# Patient Record
Sex: Female | Born: 1990 | Race: White | Hispanic: No | Marital: Married | Smoking: Never smoker
Health system: Southern US, Community
[De-identification: ages and names within clinical notes are randomized; demographics above are authoritative.]

## PROBLEM LIST (undated history)

## (undated) ENCOUNTER — Inpatient Hospital Stay (HOSPITAL_COMMUNITY): Payer: Self-pay

## (undated) DIAGNOSIS — K831 Obstruction of bile duct: Secondary | ICD-10-CM

## (undated) DIAGNOSIS — Z8719 Personal history of other diseases of the digestive system: Secondary | ICD-10-CM

## (undated) DIAGNOSIS — D693 Immune thrombocytopenic purpura: Secondary | ICD-10-CM

## (undated) DIAGNOSIS — O26619 Liver and biliary tract disorders in pregnancy, unspecified trimester: Secondary | ICD-10-CM

## (undated) DIAGNOSIS — O24419 Gestational diabetes mellitus in pregnancy, unspecified control: Secondary | ICD-10-CM

## (undated) DIAGNOSIS — Z789 Other specified health status: Secondary | ICD-10-CM

## (undated) DIAGNOSIS — O09293 Supervision of pregnancy with other poor reproductive or obstetric history, third trimester: Secondary | ICD-10-CM

## (undated) HISTORY — DX: Immune thrombocytopenic purpura: D69.3

## (undated) HISTORY — PX: DILATION AND CURETTAGE OF UTERUS: SHX78

## (undated) HISTORY — PX: NOSE SURGERY: SHX723

## (undated) HISTORY — DX: Liver and biliary tract disorders in pregnancy, unspecified trimester: O26.619

## (undated) HISTORY — DX: Obstruction of bile duct: K83.1

## (undated) HISTORY — DX: Gestational diabetes mellitus in pregnancy, unspecified control: O24.419

## (undated) HISTORY — PX: WISDOM TOOTH EXTRACTION: SHX21

---
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
## 2011-01-18 NOTE — L&D Delivery Note (Signed)
Delivery Note At 8:05 AM a viable and healthy female was delivered via Vaginal, Spontaneous Delivery (Presentation: Left; Occiput Anterior).  APGAR: 9, 9; weight Pending.   Placenta status: Intact, Spontaneous.  Cord: 3 vessels  Anesthesia: Epidural  Episiotomy: None Lacerations: Periurethral Suture Repair: vicryl 4-0 Est. Blood Loss (mL):   Mom to postpartum.  Baby to nursery-stable.  Paige Levitt H. 10/09/2011, 8:30 AM

## 2011-02-18 ENCOUNTER — Inpatient Hospital Stay (HOSPITAL_COMMUNITY)
Admission: AD | Admit: 2011-02-18 | Discharge: 2011-02-19 | Disposition: A | Discharge status: Home / Self Care | Payer: BC Managed Care – PPO | Source: Ambulatory Visit | Attending: Obstetrics and Gynecology | Admitting: Obstetrics and Gynecology

## 2011-02-18 DIAGNOSIS — B3731 Acute candidiasis of vulva and vagina: Secondary | ICD-10-CM | POA: Insufficient documentation

## 2011-02-18 DIAGNOSIS — O209 Hemorrhage in early pregnancy, unspecified: Secondary | ICD-10-CM | POA: Insufficient documentation

## 2011-02-18 DIAGNOSIS — B373 Candidiasis of vulva and vagina: Secondary | ICD-10-CM | POA: Insufficient documentation

## 2011-02-18 DIAGNOSIS — O2390 Unspecified genitourinary tract infection in pregnancy, unspecified trimester: Secondary | ICD-10-CM | POA: Insufficient documentation

## 2011-02-19 ENCOUNTER — Encounter (HOSPITAL_COMMUNITY): Payer: Self-pay

## 2011-02-19 LAB — URINALYSIS, ROUTINE W REFLEX MICROSCOPIC
Bilirubin Urine: NEGATIVE
Specific Gravity, Urine: 1.025 (ref 1.005–1.030)
pH: 6 (ref 5.0–8.0)

## 2011-02-19 LAB — WET PREP, GENITAL: Yeast Wet Prep HPF POC: NONE SEEN

## 2011-02-19 LAB — URINE MICROSCOPIC-ADD ON

## 2011-02-19 MED ORDER — TERCONAZOLE 0.4 % VA CREA: 1.0000 | TOPICAL_CREAM | Freq: Every day | VAGINAL | Status: AC

## 2011-02-19 NOTE — Progress Notes (Signed)
Patient is here with c/o pinkish /dark vaginal bleeding that started yesterday. She states that she only sees the blood when she is up and walking around. She denies any pain or recent intercourse.

## 2011-02-19 NOTE — Progress Notes (Signed)
Pt states, " I started having brown to pink vaginal bleeding yesterday at work. I went home and layed down and it stopped. I went back to work today and the bleeding started back while I was up and on my feet. I had a 6 wk miscarriage in September. I am having some cramping but it is the same as always."

## 2011-02-19 NOTE — Progress Notes (Signed)
Written and verbal d/c instructions given and understanding voiced. 

## 2011-02-19 NOTE — ED Provider Notes (Signed)
History     Chief Complaint  Patient presents with  . Vaginal Bleeding  . Abdominal Pain   HPI Paige Welch 21 y.o. [redacted]w[redacted]d gestation.  Has been for an appointment for labs and ultrasound but has not yet had physical exam in [facility].  Tonight saw some blood on tissue.  Has had for a couple of days.  Sees more when she is up walking.  Has had an ultrasound which showed a heart beat.  Is scheduled for next prenatal care appt on 2-14-3.  OB History    Grav Para Term Preterm Abortions TAB SAB Ect Mult Living   2    1  1          Past Medical History  Diagnosis Date  . No pertinent past medical history     Past Surgical History  Procedure Date  . Nose surgery   . Wisdom tooth extraction   . Dilation and curettage of uterus     Family History  Problem Relation Age of Onset  . Diabetes Maternal Aunt   . Heart disease Maternal Uncle   . Hypertension Maternal Grandmother   . Stroke Maternal Grandfather     History  Substance Use Topics  . Smoking status: Never Smoker   . Smokeless tobacco: Not on file  . Alcohol Use: No    Allergies:  Allergies  Allergen Reactions  . Penicillins Hives    Prescriptions prior to admission  Medication Sig Dispense Refill  . acetaminophen (TYLENOL) 325 MG tablet Take 650 mg by mouth every 6 (six) hours as needed. Head aches      . Prenatal Vit-Fe Fumarate-FA (PRENATAL MULTIVITAMIN) TABS Take 1 tablet by mouth at bedtime.        Review of Systems  Gastrointestinal: Negative for nausea, vomiting and abdominal pain.  Genitourinary:       Vaginal discharge Blood when wiping   Physical Exam   Blood pressure 109/68, pulse 79, temperature 98.6 F (37 C), temperature source Oral, resp. rate 20, height 5\' 3"  (1.6 m), weight 120 lb 2 oz (54.488 kg), last menstrual period 12/27/2010, SpO2 99.00%.  Physical Exam  Nursing note and vitals reviewed. Constitutional: She is oriented to person, place, and time. She appears well-developed and  well-nourished.  HENT:  Head: Normocephalic.  Eyes: EOM are normal.  Neck: Neck supple.  GI: Soft. There is no tenderness.  Genitourinary:       Speculum exam: Vulva - labia majora very pink to groin, dried discharge seen in tissue folds Vagina - mild erythema, Small amount of tan discharge, no odor Cervix - No contact bleeding, no active bleeding seen Bimanual exam: Cervix closed Uterus non tender, 8 week size Adnexa non tender, no masses bilaterally GC/Chlam, wet prep done Chaperone present for exam.  Musculoskeletal: Normal range of motion.  Neurological: She is alert and oriented to person, place, and time.  Skin: Skin is warm and dry.  Psychiatric: She has a normal mood and affect.    MAU Course  Procedures  MDM Results for orders placed during the hospital encounter of 02/18/11 (from the past 24 hour(s))  URINALYSIS, ROUTINE W REFLEX MICROSCOPIC     Status: Abnormal   Collection Time   02/19/11 12:40 AM      Component Value Range   Color, Urine YELLOW  YELLOW    APPearance CLEAR  CLEAR    Specific Gravity, Urine 1.025  1.005 - 1.030    pH 6.0  5.0 - 8.0  Glucose, UA NEGATIVE  NEGATIVE (mg/dL)   Hgb urine dipstick SMALL (*) NEGATIVE    Bilirubin Urine NEGATIVE  NEGATIVE    Ketones, ur NEGATIVE  NEGATIVE (mg/dL)   Protein, ur NEGATIVE  NEGATIVE (mg/dL)   Urobilinogen, UA 0.2  0.0 - 1.0 (mg/dL)   Nitrite NEGATIVE  NEGATIVE    Leukocytes, UA NEGATIVE  NEGATIVE   URINE MICROSCOPIC-ADD ON     Status: Abnormal   Collection Time   02/19/11 12:40 AM      Component Value Range   Squamous Epithelial / LPF FEW (*) RARE    WBC, UA 0-2  <3 (WBC/hpf)   RBC / HPF 0-2  <3 (RBC/hpf)   Bacteria, UA FEW (*) RARE    Urine-Other MUCOUS PRESENT    WET PREP, GENITAL     Status: Abnormal   Collection Time   02/19/11  1:39 AM      Component Value Range   Yeast Wet Prep HPF POC NONE SEEN  NONE SEEN    Trich, Wet Prep NONE SEEN  NONE SEEN    Clue Cells Wet Prep HPF POC FEW (*) NONE  SEEN    WBC, Wet Prep HPF POC FEW (*) NONE SEEN     Assessment and Plan  Yeast infection in pregnancy  Plan Keep your appointments for prenatal care Call your doctor if you have questions or concerns Go to [facility] if you need medical care - the facility where your doctor has privileges. rx terazol vaginal cream - use in vagina at night x 7 days  Drink at least 8 8-oz glasses of water every day.  Paige Welch 02/19/2011, 1:42 AM   Nolene Bernheim, NP 02/19/11 0202  Nolene Bernheim, NP 02/19/11 [id number]

## 2011-02-21 LAB — POCT PREGNANCY, URINE: Preg Test, Ur: POSITIVE — AB

## 2011-02-21 LAB — GC/CHLAMYDIA PROBE AMP, GENITAL
Chlamydia, DNA Probe: NEGATIVE
GC Probe Amp, Genital: NEGATIVE

## 2011-02-22 NOTE — ED Provider Notes (Signed)
Attestation of Attending Supervision of Advanced Practitioner: Evaluation and management procedures were performed by the PA/NP/CNM/OB Fellow under my supervision/collaboration. Chart reviewed and agree with management and plan.  Mckenzy Salazar V 02/22/2011 6:52 PM

## 2011-03-02 LAB — OB RESULTS CONSOLE ABO/RH

## 2011-03-02 LAB — OB RESULTS CONSOLE HIV ANTIBODY (ROUTINE TESTING): HIV: NONREACTIVE

## 2011-06-11 ENCOUNTER — Encounter (HOSPITAL_COMMUNITY): Payer: Self-pay

## 2011-06-11 ENCOUNTER — Inpatient Hospital Stay (HOSPITAL_COMMUNITY)
Admission: AD | Admit: 2011-06-11 | Discharge: 2011-06-12 | Disposition: A | Discharge status: Home / Self Care | Payer: BC Managed Care – PPO | Source: Ambulatory Visit | Attending: Obstetrics & Gynecology | Admitting: Obstetrics & Gynecology

## 2011-06-11 DIAGNOSIS — N949 Unspecified condition associated with female genital organs and menstrual cycle: Secondary | ICD-10-CM

## 2011-06-11 DIAGNOSIS — R109 Unspecified abdominal pain: Secondary | ICD-10-CM | POA: Insufficient documentation

## 2011-06-11 DIAGNOSIS — O479 False labor, unspecified: Secondary | ICD-10-CM

## 2011-06-11 DIAGNOSIS — O4700 False labor before 37 completed weeks of gestation, unspecified trimester: Secondary | ICD-10-CM | POA: Insufficient documentation

## 2011-06-11 LAB — URINALYSIS, ROUTINE W REFLEX MICROSCOPIC
Glucose, UA: NEGATIVE mg/dL
Leukocytes, UA: NEGATIVE
Nitrite: NEGATIVE
Protein, ur: NEGATIVE mg/dL

## 2011-06-11 NOTE — MAU Note (Signed)
Lower abdominal cramping started this morning. Denies vaginal bleeding. Small amount of white discharge. Last intercourse x1 week ago. Reports positive fetal movement.

## 2011-06-11 NOTE — MAU Provider Note (Signed)
Chief Complaint:  No chief complaint on file.   None     HPI  Paige Welch is a 21 y.o. G2P0010 at [redacted]w[redacted]d presenting with cramping ~2x/hr since this morning.  Denies contractions, leakage of fluid or vaginal bleeding. Good fetal movement.   Pregnancy Course: uncomplicated  Past Medical History: Past Medical History  Diagnosis Date  . No pertinent past medical history     Past Surgical History: Past Surgical History  Procedure Date  . Nose surgery   . Wisdom tooth extraction   . Dilation and curettage of uterus     Family History: Family History  Problem Relation Age of Onset  . Diabetes Maternal Aunt   . Heart disease Maternal Uncle   . Hypertension Maternal Grandmother   . Stroke Maternal Grandfather   . Anesthesia problems Neg Hx     Social History: History  Substance Use Topics  . Smoking status: Never Smoker   . Smokeless tobacco: Not on file  . Alcohol Use: No    Allergies:  Allergies  Allergen Reactions  . Penicillins Hives    Meds:  Prescriptions prior to admission  Medication Sig Dispense Refill  . acetaminophen (TYLENOL) 325 MG tablet Take 650 mg by mouth every 6 (six) hours as needed. Head aches      . Prenatal Vit-Fe Fumarate-FA (PRENATAL MULTIVITAMIN) TABS Take 1 tablet by mouth at bedtime.       ROS: Otherwise neg   Physical Exam  Blood pressure 109/65, pulse 83, temperature 97.7 F (36.5 C), temperature source Oral, resp. rate 18, height 5\' 3"  (1.6 m), weight 60.963 kg (134 lb 6.4 oz), last menstrual period 12/27/2010. GENERAL: Well-developed, well-nourished female in no acute distress.  HEENT: normocephalic, good dentition HEART: normal rate RESP: normal effort ABDOMEN: Soft, nontender, nondistended, gravid.  EXTREMITIES: Nontender, no edema NEURO: alert and oriented  SPECULUM EXAM: Physiologic discharge, no blood. Cervix clean.  FHT:  Baseline 140 , moderate variability, no accelerations, no decelerations Contractions:  none  Dilation: Closed Effacement (%): Thick Exam by:: V Taheera Thomann CNM   Labs: Results for orders placed during the hospital encounter of 06/11/11 (from the past 24 hour(s))  URINALYSIS, ROUTINE W REFLEX MICROSCOPIC     Status: Abnormal   Collection Time   06/11/11  9:58 PM      Component Value Range   Color, Urine YELLOW  YELLOW    APPearance CLOUDY (*) CLEAR    Specific Gravity, Urine 1.015  1.005 - 1.030    pH 8.0  5.0 - 8.0    Glucose, UA NEGATIVE  NEGATIVE (mg/dL)   Hgb urine dipstick NEGATIVE  NEGATIVE    Bilirubin Urine NEGATIVE  NEGATIVE    Ketones, ur NEGATIVE  NEGATIVE (mg/dL)   Protein, ur NEGATIVE  NEGATIVE (mg/dL)   Urobilinogen, UA 0.2  0.0 - 1.0 (mg/dL)   Nitrite NEGATIVE  NEGATIVE    Leukocytes, UA NEGATIVE  NEGATIVE    Imaging:  NA  Assessment: 1. Preterm contractions   2. Round ligament pain    Plan: D/C home Follow-up Information    Follow up with Mickel Baas, MD. Schedule an appointment as soon as possible for a visit on 06/14/2011. (As needed if symptoms worsen)    Contact information:   [address] [place] [address] [phone]         Medication List  As of 06/12/2011  6:28 AM   CONTINUE taking these medications  prenatal multivitamin Tabs           PTL precautions and Mertie Clause, Marquelle Balow 5/25/201311:02 PM

## 2011-10-04 ENCOUNTER — Encounter (HOSPITAL_COMMUNITY): Payer: Self-pay | Admitting: *Deleted

## 2011-10-04 ENCOUNTER — Inpatient Hospital Stay (HOSPITAL_COMMUNITY)
Admission: AD | Admit: 2011-10-04 | Discharge: 2011-10-04 | Disposition: A | Discharge status: Home / Self Care | Payer: BC Managed Care – PPO | Source: Ambulatory Visit | Attending: Obstetrics and Gynecology | Admitting: Obstetrics and Gynecology

## 2011-10-04 DIAGNOSIS — O479 False labor, unspecified: Secondary | ICD-10-CM | POA: Insufficient documentation

## 2011-10-04 NOTE — MAU Note (Signed)
Dr. Claiborne Billings notified of pt.  Pt may walk x 1 hr or D/C home.

## 2011-10-04 NOTE — MAU Note (Signed)
Pt to walk.

## 2011-10-04 NOTE — MAU Note (Signed)
Pt returned from walking.  Monitors applied. 

## 2011-10-04 NOTE — MAU Note (Signed)
Contractions, "really bad back pain", denies bleeding or ROM

## 2011-10-08 ENCOUNTER — Inpatient Hospital Stay (HOSPITAL_COMMUNITY)
Admission: AD | Admit: 2011-10-08 | Discharge: 2011-10-08 | Disposition: A | Discharge status: Home / Self Care | Payer: BC Managed Care – PPO | Source: Ambulatory Visit | Attending: Obstetrics and Gynecology | Admitting: Obstetrics and Gynecology

## 2011-10-08 DIAGNOSIS — O479 False labor, unspecified: Secondary | ICD-10-CM | POA: Insufficient documentation

## 2011-10-08 NOTE — MAU Note (Signed)
Pt presents with contractions q 3-4 minutes for the past 2 hours. Pt denies pih symptoms, srom, bleeding.  + fm per pt. Pt is to be induced tomorrow night 10/09/11.  Pt with c/o mucous "snot like" discharge.

## 2011-10-08 NOTE — MAU Note (Signed)
Patient taken directly to room 10 from lobby with c/o contractions for several hours and increased mucus discharge.

## 2011-10-09 ENCOUNTER — Encounter (HOSPITAL_COMMUNITY): Payer: Self-pay | Admitting: *Deleted

## 2011-10-09 ENCOUNTER — Encounter (HOSPITAL_COMMUNITY): Payer: Self-pay | Admitting: Anesthesiology

## 2011-10-09 ENCOUNTER — Inpatient Hospital Stay (HOSPITAL_COMMUNITY): Payer: BC Managed Care – PPO | Admitting: Anesthesiology

## 2011-10-09 ENCOUNTER — Inpatient Hospital Stay (HOSPITAL_COMMUNITY)
Admission: AD | Admit: 2011-10-09 | Discharge: 2011-10-10 | DRG: 373 | Disposition: A | Discharge status: Home / Self Care | Payer: BC Managed Care – PPO | Source: Ambulatory Visit | Attending: Obstetrics and Gynecology | Admitting: Obstetrics and Gynecology

## 2011-10-09 LAB — CBC
MCV: 87.6 fL (ref 78.0–100.0)
Platelets: 280 10*3/uL (ref 150–400)
RDW: 14.2 % (ref 11.5–15.5)
WBC: 16.7 10*3/uL — ABNORMAL HIGH (ref 4.0–10.5)

## 2011-10-09 LAB — RPR: RPR Ser Ql: NONREACTIVE

## 2011-10-09 MED ORDER — PRENATAL MULTIVITAMIN CH
1.0000 | ORAL_TABLET | Freq: Every day | ORAL | Status: DC
  Administered 2011-10-10: 1 via ORAL
  Filled 2011-10-09: qty 1

## 2011-10-09 MED ORDER — LIDOCAINE HCL (PF) 1 % IJ SOLN
INTRAMUSCULAR | Status: DC | PRN
  Administered 2011-10-09 (×2): 8 mL

## 2011-10-09 MED ORDER — LACTATED RINGERS IV SOLN
INTRAVENOUS | Status: DC
  Administered 2011-10-09 (×2): via INTRAVENOUS

## 2011-10-09 MED ORDER — SIMETHICONE 80 MG PO CHEW: 80.0000 mg | CHEWABLE_TABLET | ORAL | Status: DC | PRN

## 2011-10-09 MED ORDER — LIDOCAINE HCL (PF) 1 % IJ SOLN
30.0000 mL | INTRAMUSCULAR | Status: DC | PRN
  Administered 2011-10-09: 30 mL via SUBCUTANEOUS
  Filled 2011-10-09: qty 30

## 2011-10-09 MED ORDER — FENTANYL 2.5 MCG/ML BUPIVACAINE 1/10 % EPIDURAL INFUSION (WH - ANES): 14.0000 mL/h | INTRAMUSCULAR | Status: DC

## 2011-10-09 MED ORDER — SENNOSIDES-DOCUSATE SODIUM 8.6-50 MG PO TABS
2.0000 | ORAL_TABLET | Freq: Every day | ORAL | Status: DC
  Administered 2011-10-09: 2 via ORAL

## 2011-10-09 MED ORDER — LACTATED RINGERS IV SOLN: 500.0000 mL | INTRAVENOUS | Status: DC | PRN

## 2011-10-09 MED ORDER — PHENYLEPHRINE 40 MCG/ML (10ML) SYRINGE FOR IV PUSH (FOR BLOOD PRESSURE SUPPORT): 80.0000 ug | PREFILLED_SYRINGE | INTRAVENOUS | Status: DC | PRN

## 2011-10-09 MED ORDER — OXYCODONE-ACETAMINOPHEN 5-325 MG PO TABS: 1.0000 | ORAL_TABLET | ORAL | Status: DC | PRN

## 2011-10-09 MED ORDER — DIBUCAINE 1 % RE OINT: 1.0000 "application " | TOPICAL_OINTMENT | RECTAL | Status: DC | PRN

## 2011-10-09 MED ORDER — LACTATED RINGERS IV SOLN
500.0000 mL | Freq: Once | INTRAVENOUS | Status: AC
  Administered 2011-10-09: 500 mL via INTRAVENOUS

## 2011-10-09 MED ORDER — OXYTOCIN 40 UNITS IN LACTATED RINGERS INFUSION - SIMPLE MED
62.5000 mL/h | Freq: Once | INTRAVENOUS | Status: DC
  Filled 2011-10-09: qty 1000

## 2011-10-09 MED ORDER — IBUPROFEN 600 MG PO TABS: 600.0000 mg | ORAL_TABLET | Freq: Four times a day (QID) | ORAL | Status: DC | PRN

## 2011-10-09 MED ORDER — IBUPROFEN 600 MG PO TABS
600.0000 mg | ORAL_TABLET | Freq: Four times a day (QID) | ORAL | Status: DC
  Administered 2011-10-09 – 2011-10-10 (×6): 600 mg via ORAL
  Filled 2011-10-09 (×6): qty 1

## 2011-10-09 MED ORDER — BENZOCAINE-MENTHOL 20-0.5 % EX AERO
1.0000 "application " | INHALATION_SPRAY | CUTANEOUS | Status: DC | PRN
  Filled 2011-10-09: qty 56

## 2011-10-09 MED ORDER — ACETAMINOPHEN 325 MG PO TABS: 650.0000 mg | ORAL_TABLET | ORAL | Status: DC | PRN

## 2011-10-09 MED ORDER — OXYCODONE-ACETAMINOPHEN 5-325 MG PO TABS
1.0000 | ORAL_TABLET | ORAL | Status: DC | PRN
  Administered 2011-10-09: 1 via ORAL
  Filled 2011-10-09: qty 1

## 2011-10-09 MED ORDER — BUTORPHANOL TARTRATE 1 MG/ML IJ SOLN: 1.0000 mg | INTRAMUSCULAR | Status: DC | PRN

## 2011-10-09 MED ORDER — OXYTOCIN BOLUS FROM INFUSION
500.0000 mL | Freq: Once | INTRAVENOUS | Status: DC
  Filled 2011-10-09: qty 500

## 2011-10-09 MED ORDER — DIPHENHYDRAMINE HCL 50 MG/ML IJ SOLN: 12.5000 mg | INTRAMUSCULAR | Status: DC | PRN

## 2011-10-09 MED ORDER — METHYLERGONOVINE MALEATE 0.2 MG/ML IJ SOLN: 0.2000 mg | INTRAMUSCULAR | Status: DC | PRN

## 2011-10-09 MED ORDER — ZOLPIDEM TARTRATE 5 MG PO TABS: 5.0000 mg | ORAL_TABLET | Freq: Every evening | ORAL | Status: DC | PRN

## 2011-10-09 MED ORDER — ONDANSETRON HCL 4 MG PO TABS: 4.0000 mg | ORAL_TABLET | ORAL | Status: DC | PRN

## 2011-10-09 MED ORDER — EPHEDRINE 5 MG/ML INJ: 10.0000 mg | INTRAVENOUS | Status: DC | PRN

## 2011-10-09 MED ORDER — FENTANYL 2.5 MCG/ML BUPIVACAINE 1/10 % EPIDURAL INFUSION (WH - ANES)
INTRAMUSCULAR | Status: DC | PRN
  Administered 2011-10-09: 14 mL/h via EPIDURAL

## 2011-10-09 MED ORDER — ONDANSETRON HCL 4 MG/2ML IJ SOLN
4.0000 mg | Freq: Four times a day (QID) | INTRAMUSCULAR | Status: DC | PRN
  Administered 2011-10-09: 4 mg via INTRAVENOUS
  Filled 2011-10-09: qty 2

## 2011-10-09 MED ORDER — EPHEDRINE 5 MG/ML INJ
10.0000 mg | INTRAVENOUS | Status: DC | PRN
  Filled 2011-10-09: qty 4

## 2011-10-09 MED ORDER — PHENYLEPHRINE 40 MCG/ML (10ML) SYRINGE FOR IV PUSH (FOR BLOOD PRESSURE SUPPORT)
80.0000 ug | PREFILLED_SYRINGE | INTRAVENOUS | Status: DC | PRN
  Filled 2011-10-09: qty 5

## 2011-10-09 MED ORDER — DIPHENHYDRAMINE HCL 25 MG PO CAPS: 25.0000 mg | ORAL_CAPSULE | Freq: Four times a day (QID) | ORAL | Status: DC | PRN

## 2011-10-09 MED ORDER — ONDANSETRON HCL 4 MG/2ML IJ SOLN: 4.0000 mg | INTRAMUSCULAR | Status: DC | PRN

## 2011-10-09 MED ORDER — METHYLERGONOVINE MALEATE 0.2 MG PO TABS: 0.2000 mg | ORAL_TABLET | ORAL | Status: DC | PRN

## 2011-10-09 MED ORDER — WITCH HAZEL-GLYCERIN EX PADS: 1.0000 "application " | MEDICATED_PAD | CUTANEOUS | Status: DC | PRN

## 2011-10-09 MED ORDER — LACTATED RINGERS IV SOLN: 500.0000 mL | Freq: Once | INTRAVENOUS | Status: DC

## 2011-10-09 MED ORDER — CITRIC ACID-SODIUM CITRATE 334-500 MG/5ML PO SOLN: 30.0000 mL | ORAL | Status: DC | PRN

## 2011-10-09 MED ORDER — TETANUS-DIPHTH-ACELL PERTUSSIS 5-2.5-18.5 LF-MCG/0.5 IM SUSP
0.5000 mL | Freq: Once | INTRAMUSCULAR | Status: AC
  Administered 2011-10-10: 0.5 mL via INTRAMUSCULAR
  Filled 2011-10-09: qty 0.5

## 2011-10-09 MED ORDER — FENTANYL 2.5 MCG/ML BUPIVACAINE 1/10 % EPIDURAL INFUSION (WH - ANES)
14.0000 mL/h | INTRAMUSCULAR | Status: DC
  Filled 2011-10-09: qty 60

## 2011-10-09 MED ORDER — LANOLIN HYDROUS EX OINT: TOPICAL_OINTMENT | CUTANEOUS | Status: DC | PRN

## 2011-10-09 NOTE — Anesthesia Procedure Notes (Signed)
Epidural Patient location during procedure: OB Start time: 10/09/2011 4:54 AM End time: 10/09/2011 4:58 AM  Staffing Anesthesiologist: Sandrea Hughs Performed by: anesthesiologist   Preanesthetic Checklist Completed: patient identified, site marked, surgical consent, pre-op evaluation, timeout performed, IV checked, risks and benefits discussed and monitors and equipment checked  Epidural Patient position: sitting Prep: site prepped and draped and DuraPrep Patient monitoring: continuous pulse ox and blood pressure Approach: midline Injection technique: LOR air  Needle:  Needle type: Tuohy  Needle gauge: 17 G Needle length: 9 cm and 9 Needle insertion depth: 6 cm Catheter type: closed end flexible Catheter size: 19 Gauge Catheter at skin depth: 10 cm Test dose: negative and Other  Assessment Sensory level: T8 Events: blood not aspirated, injection not painful, no injection resistance, negative IV test and no paresthesia  Additional Notes Reason for block:procedure for pain

## 2011-10-09 NOTE — Anesthesia Postprocedure Evaluation (Signed)
  Anesthesia Post-op Note  Patient: Paige Welch  Procedure(s) Performed: * No procedures listed *  Patient Location: PACU and Mother/Baby  Anesthesia Type: Epidural  Level of Consciousness: awake, alert  and oriented  Airway and Oxygen Therapy: Patient Spontanous Breathing    Post-op Assessment: Patient's Cardiovascular Status Stable and Respiratory Function Stable  Post-op Vital Signs: stable  Complications: No apparent anesthesia complications

## 2011-10-09 NOTE — MAU Note (Signed)
Pt returned to MAU with stronger uc's

## 2011-10-09 NOTE — Anesthesia Preprocedure Evaluation (Signed)

## 2011-10-09 NOTE — H&P (Signed)
Paige Welch is a 21 y.o. female presenting for labor  Pt presentred to MAU with painful contractions.  She had been seen earlier in the night and had been sent home for no cervical change.  She returned c/o more painful contractions and had made cervical change and was admitted. Pt transferred care at 19 weeks, pregnancy has been uncomplicated to this point.  History OB History    Grav Para Term Preterm Abortions TAB SAB Ect Mult Living   2 0 0 0 1 0 1 0 0 0      Past Medical History  Diagnosis Date  . No pertinent past medical history    Past Surgical History  Procedure Date  . Nose surgery   . Wisdom tooth extraction   . Dilation and curettage of uterus    Family History: family history includes Diabetes in her maternal aunt; Heart disease in her maternal uncle; Hypertension in her maternal grandmother; and Stroke in her maternal grandfather.  There is no history of Anesthesia problems. Social History:  reports that she has never smoked. She does not have any smokeless tobacco history on file. She reports that she does not drink alcohol or use illicit drugs.   Prenatal Transfer Tool  Maternal Diabetes: No Genetic Screening:  Did not have done Maternal Ultrasounds/Referrals: Normal Fetal Ultrasounds or other Referrals:  None Maternal Substance Abuse:  No Significant Maternal Medications:  None Significant Maternal Lab Results:  None Other Comments:  None  ROS: as above  Dilation: 10 Effacement (%): 100 Station: +2;+3 Exam by:: A.Davis,RN Blood pressure 103/59, pulse 101, temperature 98.2 F (36.8 C), temperature source Oral, resp. rate 18, last menstrual period 12/27/2010. Exam Physical Exam  Prenatal labs: ABO, Rh: O/Positive/-- (02/13 0000) Antibody: Negative (02/13 0000) Rubella: Immune (02/13 0000) RPR: NON REACTIVE (09/22 0403)  HBsAg: Negative (02/13 0000)  HIV: Non-reactive (02/13 0000)  GBS:   negative  Assessment/Plan: 1) Admit 2)  Epidural  Deshante Cassell H. 10/09/2011, 8:33 AM

## 2011-10-10 LAB — CBC
Platelets: 241 10*3/uL (ref 150–400)
RBC: 3.52 MIL/uL — ABNORMAL LOW (ref 3.87–5.11)
RDW: 14.4 % (ref 11.5–15.5)
WBC: 15.5 10*3/uL — ABNORMAL HIGH (ref 4.0–10.5)

## 2011-10-10 MED ORDER — INFLUENZA VIRUS VACC SPLIT PF IM SUSP
0.5000 mL | Freq: Once | INTRAMUSCULAR | Status: AC
  Administered 2011-10-10: 0.5 mL via INTRAMUSCULAR
  Filled 2011-10-10: qty 0.5

## 2011-10-10 MED ORDER — IBUPROFEN 600 MG PO TABS: 600.0000 mg | ORAL_TABLET | Freq: Four times a day (QID) | ORAL | Status: DC

## 2011-10-10 NOTE — Discharge Summary (Signed)
Obstetric Discharge Summary Reason for Admission: onset of labor Prenatal Procedures: none Intrapartum Procedures: spontaneous vaginal delivery Postpartum Procedures: none Complications-Operative and Postpartum: periurethral laceration Hemoglobin  Date Value Range Status  10/10/2011 10.1* 12.0 - 15.0 g/dL Final     HCT  Date Value Range Status  10/10/2011 31.3* 36.0 - 46.0 % Final    Physical Exam:  General: alert and cooperative Lochia: appropriate Uterine Fundus: firm DVT Evaluation: No evidence of DVT seen on physical exam.  Discharge Diagnoses: Term Pregnancy-delivered  Discharge Information: Date: 10/10/2011 Activity: pelvic rest Diet: routine Medications: PNV and Ibuprofen Condition: stable Instructions: refer to practice specific booklet Discharge to: home Follow-up Information    Follow up with Almon Hercules., MD. In 4 weeks.   Contact information:   [address] [place] [address] [phone]          Newborn Data: Live born female  Birth Weight: 6 lb 12.1 oz (3065 g) APGAR: 9, 9  Home with mother.  Paige Welch 10/10/2011, 9:36 AM

## 2013-02-07 ENCOUNTER — Other Ambulatory Visit: Payer: Self-pay | Admitting: Obstetrics and Gynecology

## 2013-02-07 DIAGNOSIS — N6310 Unspecified lump in the right breast, unspecified quadrant: Secondary | ICD-10-CM

## 2013-02-14 ENCOUNTER — Ambulatory Visit
Admission: RE | Admit: 2013-02-14 | Discharge: 2013-02-14 | Disposition: A | Discharge status: Home / Self Care | Payer: Commercial Indemnity | Source: Ambulatory Visit | Attending: Obstetrics and Gynecology | Admitting: Obstetrics and Gynecology

## 2013-02-14 DIAGNOSIS — N6310 Unspecified lump in the right breast, unspecified quadrant: Secondary | ICD-10-CM

## 2013-07-29 ENCOUNTER — Other Ambulatory Visit: Payer: Self-pay | Admitting: Obstetrics and Gynecology

## 2013-07-29 DIAGNOSIS — N63 Unspecified lump in unspecified breast: Secondary | ICD-10-CM

## 2013-08-01 ENCOUNTER — Ambulatory Visit
Admission: RE | Admit: 2013-08-01 | Discharge: 2013-08-01 | Disposition: A | Discharge status: Home / Self Care | Payer: Commercial Indemnity | Source: Ambulatory Visit | Attending: Obstetrics and Gynecology | Admitting: Obstetrics and Gynecology

## 2013-08-01 ENCOUNTER — Encounter (INDEPENDENT_AMBULATORY_CARE_PROVIDER_SITE_OTHER): Payer: Self-pay

## 2013-08-01 DIAGNOSIS — N63 Unspecified lump in unspecified breast: Secondary | ICD-10-CM

## 2013-09-13 ENCOUNTER — Emergency Department (HOSPITAL_COMMUNITY): Payer: Managed Care, Other (non HMO)

## 2013-09-13 ENCOUNTER — Emergency Department (HOSPITAL_COMMUNITY)
Admission: EM | Admit: 2013-09-13 | Discharge: 2013-09-13 | Disposition: A | Discharge status: Home / Self Care | Payer: Managed Care, Other (non HMO) | Attending: Emergency Medicine | Admitting: Emergency Medicine

## 2013-09-13 ENCOUNTER — Encounter (HOSPITAL_COMMUNITY): Payer: Self-pay | Admitting: Emergency Medicine

## 2013-09-13 DIAGNOSIS — M779 Enthesopathy, unspecified: Secondary | ICD-10-CM

## 2013-09-13 DIAGNOSIS — M79609 Pain in unspecified limb: Secondary | ICD-10-CM | POA: Diagnosis present

## 2013-09-13 DIAGNOSIS — Z79899 Other long term (current) drug therapy: Secondary | ICD-10-CM | POA: Diagnosis not present

## 2013-09-13 DIAGNOSIS — Z880 Allergy status to penicillin: Secondary | ICD-10-CM | POA: Insufficient documentation

## 2013-09-13 DIAGNOSIS — M778 Other enthesopathies, not elsewhere classified: Secondary | ICD-10-CM

## 2013-09-13 DIAGNOSIS — Z791 Long term (current) use of non-steroidal anti-inflammatories (NSAID): Secondary | ICD-10-CM | POA: Diagnosis not present

## 2013-09-13 DIAGNOSIS — M65839 Other synovitis and tenosynovitis, unspecified forearm: Secondary | ICD-10-CM | POA: Diagnosis not present

## 2013-09-13 DIAGNOSIS — M65849 Other synovitis and tenosynovitis, unspecified hand: Secondary | ICD-10-CM | POA: Diagnosis not present

## 2013-09-13 MED ORDER — NAPROXEN 500 MG PO TABS: 500.0000 mg | ORAL_TABLET | Freq: Two times a day (BID) | ORAL | Status: DC

## 2013-09-13 NOTE — ED Notes (Signed)
Ortho called 

## 2013-09-13 NOTE — ED Notes (Signed)
Per patient- started noticing left hand swelling today. Doesn't remember injuring hand or wrist. Unsure of precipitating events. States, "it's been hurting for awhile but I thought it was nothing." Hasn't taken any medications today for swelling or pain. Has full ROM in left hand and left wrist. In NAD. Awaiting PA/MD.

## 2013-09-13 NOTE — ED Notes (Signed)
Ortho tech at bedside to explain thumb spica use. Given specific instructions on naproxen use and ice, elevation. No other questions/concerns.

## 2013-09-13 NOTE — ED Provider Notes (Signed)
Medical screening examination/treatment/procedure(s) were performed by non-physician practitioner and as supervising physician I was immediately available for consultation/collaboration.   EKG Interpretation None        Layla Maw Ward, DO 09/13/13 2341

## 2013-09-13 NOTE — Discharge Instructions (Signed)
Naprosyn for inflammation. Thumb spika splint day and night for a week. Follow up with hand specialist if not improving.    Tendinitis Tendinitis is swelling and inflammation of the tendons. Tendons are band-like tissues that connect muscle to bone. Tendinitis commonly occurs in the:   Shoulders (rotator cuff).  Heels (Achilles tendon).  Elbows (triceps tendon). CAUSES Tendinitis is usually caused by overusing the tendon, muscles, and joints involved. When the tissue surrounding a tendon (synovium) becomes inflamed, it is called tenosynovitis. Tendinitis commonly develops in people whose jobs require repetitive motions. SYMPTOMS  Pain.  Tenderness.  Mild swelling. DIAGNOSIS Tendinitis is usually diagnosed by physical exam. Your health care provider may also order X-rays or other imaging tests. TREATMENT Your health care provider may recommend certain medicines or exercises for your treatment. HOME CARE INSTRUCTIONS   Use a sling or splint for as long as directed by your health care provider until the pain decreases.  Put ice on the injured area.  Put ice in a plastic bag.  Place a towel between your skin and the bag.  Leave the ice on for 15-20 minutes, 3-4 times a day, or as directed by your health care provider.  Avoid using the limb while the tendon is painful. Perform gentle range of motion exercises only as directed by your health care provider. Stop exercises if pain or discomfort increase, unless directed otherwise by your health care provider.  Only take over-the-counter or prescription medicines for pain, discomfort, or fever as directed by your health care provider. SEEK MEDICAL CARE IF:   Your pain and swelling increase.  You develop new, unexplained symptoms, especially increased numbness in the hands. MAKE SURE YOU:   Understand these instructions.  Will watch your condition.  Will get help right away if you are not doing well or get worse. Document  Released: 01/01/2000 Document Revised: 05/20/2013 Document Reviewed: 03/22/2010 [facility] Patient Information 2015 [place], [place]. This information is not intended to replace advice given to you by your health care provider. Make sure you discuss any questions you have with your health care provider.

## 2013-09-13 NOTE — ED Provider Notes (Signed)
CSN: [id number]     Arrival date & time 09/13/13  1740 History  This chart was scribed for non-physician practitioner, Paige Crumble, PA-C working with Paige Maw Ward, DO by Paige Welch, ED scribe. This patient was seen in room WTR5/WTR5 and the patient's care was started at 6:23 PM.    Chief Complaint  Patient presents with  . Hand Injury    left hand   The history is provided by the patient. No language interpreter was used.   HPI Comments: Paige Welch is a 23 y.o. female who presents to the Emergency Department complaining of  left hand pain that started a while ago but worsened today. She is currently complaining of associated swelling. 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 She states that the pain presents mostly after work and she is right hand dominant. Denies any numbness or tingling in the left hand. No fever, recent falls, or chills.   Past Medical History  Diagnosis Date  . No pertinent past medical history    Past Surgical History  Procedure Laterality Date  . Nose surgery    . Wisdom tooth extraction    . Dilation and curettage of uterus     Family History  Problem Relation Age of Onset  . Diabetes Maternal Aunt   . Heart disease Maternal Uncle   . Hypertension Maternal Grandmother   . Stroke Maternal Grandfather   . Anesthesia problems Neg Hx    History  Substance Use Topics  . Smoking status: Never Smoker   . Smokeless tobacco: Not on file  . Alcohol Use: No   OB History   Grav Para Term Preterm Abortions TAB SAB Ect Mult Living   0 1 0 1 0 0 1     Review of Systems  Constitutional: Negative for fever and chills.  Respiratory: Negative for cough and shortness of breath.   Musculoskeletal: Positive for arthralgias, joint swelling and myalgias.  Skin: Negative for rash.  Neurological: Negative for weakness and  numbness.      Allergies  Penicillins  Home Medications   Prior to Admission medications   Medication Sig Start Date End Date Taking? Authorizing Provider  acetaminophen (TYLENOL) 325 MG tablet Take 650 mg by mouth every 6 (six) hours as needed. headache    Historical Provider, MD  ibuprofen (ADVIL,MOTRIN) 600 MG tablet Take 1 tablet (600 mg total) by mouth every 6 (six) hours. 10/10/11   Paige Aspen, DO  Prenatal Vit-Fe Fumarate-FA (PRENATAL MULTIVITAMIN) TABS Take 1 tablet by mouth at bedtime.    Historical Provider, MD   Triage vitals:BP 115/70  Pulse 79  Temp(Src) 98 F (36.7 C) (Oral)  Resp 17  SpO2 100%  Physical Exam  Nursing note and vitals reviewed. Constitutional: She is oriented to person, place, and time. She appears well-developed and well-nourished. No distress.  HENT:  Head: Normocephalic and atraumatic.  Eyes: Conjunctivae and EOM are normal.  Neck: Neck supple.  Cardiovascular: Normal rate.   Pulmonary/Chest: Effort normal. No respiratory distress.  Musculoskeletal: Normal range of motion.  Normal appearing left hand with no swelling or erythema. Tender over thenar eminence. Full rom of the thumb. Normal strength with opposition of the thumb. No other tenderness including over wrist or hand.   Neurological: She is alert and oriented to person, place, and time.  Skin: Skin is  warm and dry.  Psychiatric: She has a normal mood and affect. Her behavior is normal.    ED Course  Procedures (including critical care time)  DIAGNOSTIC STUDIES: Oxygen Saturation is 100% on RA, normal by my interpretation.    COORDINATION OF CARE: 6:29 PM-Will order a left hand X-Ray. Advised pt to follow up with her PCP if symptoms don't improve. Pt advised of plan for treatment and pt agrees.  Imaging Review No results found.   MDM   Final diagnoses:  Thumb tendonitis    Normal x-ray of hand. Tender to palpation over thenar eminence. Suspect tendonitis vs  contusion. Home with thumb spika. Follow up as needed. Pt requested hand specialist for follow up. Also advised to start on NSAIDs.   Filed Vitals:   09/13/13 1748  BP: 115/70  Pulse: 79  Temp: 98 F (36.7 C)  Resp: 17    I personally performed the services described in this documentation, which was scribed in my presence. The recorded information has been reviewed and is accurate.    Paige Mussel, PA-C 09/13/13 2138

## 2013-11-18 ENCOUNTER — Encounter (HOSPITAL_COMMUNITY): Payer: Self-pay | Admitting: Emergency Medicine

## 2014-01-03 ENCOUNTER — Other Ambulatory Visit: Payer: Self-pay | Admitting: Obstetrics and Gynecology

## 2014-01-03 DIAGNOSIS — N63 Unspecified lump in unspecified breast: Secondary | ICD-10-CM

## 2014-01-31 ENCOUNTER — Other Ambulatory Visit: Payer: Commercial Indemnity

## 2015-10-08 ENCOUNTER — Emergency Department (HOSPITAL_COMMUNITY)
Admission: EM | Admit: 2015-10-08 | Discharge: 2015-10-08 | Disposition: A | Discharge status: Home / Self Care | Payer: Managed Care, Other (non HMO) | Attending: Emergency Medicine | Admitting: Emergency Medicine

## 2015-10-08 ENCOUNTER — Encounter (HOSPITAL_COMMUNITY): Payer: Self-pay

## 2015-10-08 DIAGNOSIS — R42 Dizziness and giddiness: Secondary | ICD-10-CM | POA: Insufficient documentation

## 2015-10-08 DIAGNOSIS — H9201 Otalgia, right ear: Secondary | ICD-10-CM | POA: Insufficient documentation

## 2015-10-08 DIAGNOSIS — R51 Headache: Secondary | ICD-10-CM | POA: Insufficient documentation

## 2015-10-08 DIAGNOSIS — H53149 Visual discomfort, unspecified: Secondary | ICD-10-CM | POA: Insufficient documentation

## 2015-10-08 DIAGNOSIS — R110 Nausea: Secondary | ICD-10-CM | POA: Diagnosis not present

## 2015-10-08 DIAGNOSIS — Z79899 Other long term (current) drug therapy: Secondary | ICD-10-CM | POA: Insufficient documentation

## 2015-10-08 DIAGNOSIS — R519 Headache, unspecified: Secondary | ICD-10-CM

## 2015-10-08 LAB — URINALYSIS, ROUTINE W REFLEX MICROSCOPIC
BILIRUBIN URINE: NEGATIVE
Glucose, UA: NEGATIVE mg/dL
KETONES UR: NEGATIVE mg/dL
NITRITE: NEGATIVE
PH: 7 (ref 5.0–8.0)
Protein, ur: NEGATIVE mg/dL
SPECIFIC GRAVITY, URINE: 1.01 (ref 1.005–1.030)

## 2015-10-08 LAB — URINE MICROSCOPIC-ADD ON

## 2015-10-08 MED ORDER — KETOROLAC TROMETHAMINE 30 MG/ML IJ SOLN
30.0000 mg | Freq: Once | INTRAMUSCULAR | Status: DC
  Filled 2015-10-08: qty 1

## 2015-10-08 MED ORDER — METOCLOPRAMIDE HCL 5 MG/ML IJ SOLN
10.0000 mg | Freq: Once | INTRAMUSCULAR | Status: AC
  Administered 2015-10-08: 10 mg via INTRAVENOUS
  Filled 2015-10-08: qty 2

## 2015-10-08 MED ORDER — SODIUM CHLORIDE 0.9 % IV BOLUS (SEPSIS)
1000.0000 mL | Freq: Once | INTRAVENOUS | Status: AC
  Administered 2015-10-08: 1000 mL via INTRAVENOUS

## 2015-10-08 MED ORDER — DIPHENHYDRAMINE HCL 50 MG/ML IJ SOLN
25.0000 mg | Freq: Once | INTRAMUSCULAR | Status: AC
  Administered 2015-10-08: 25 mg via INTRAVENOUS
  Filled 2015-10-08: qty 1

## 2015-10-08 MED ORDER — METOCLOPRAMIDE HCL 10 MG PO TABS: 10.0000 mg | ORAL_TABLET | Freq: Four times a day (QID) | ORAL | 0 refills | Status: DC | PRN

## 2015-10-08 MED ORDER — DIPHENHYDRAMINE HCL 25 MG PO CAPS
25.0000 mg | ORAL_CAPSULE | Freq: Once | ORAL | Status: DC
  Filled 2015-10-08: qty 1

## 2015-10-08 MED ORDER — KETOROLAC TROMETHAMINE 30 MG/ML IJ SOLN
30.0000 mg | Freq: Once | INTRAMUSCULAR | Status: AC
  Administered 2015-10-08: 30 mg via INTRAVENOUS

## 2015-10-08 NOTE — ED Triage Notes (Signed)
Pt c/o headache, nausea, and R ear pain x 1 week.  Pain score 5/10.  Pt reports taking Fioricet w/o relief.  + Blurred vision and Lightsensitivity

## 2015-10-08 NOTE — Progress Notes (Signed)
Patient confirms he pcp is Dr. Ardelle ParkHaque at [facility] [place].  System updated.

## 2015-10-08 NOTE — ED Provider Notes (Signed)
[facility]-EMERGENCY DEPT Provider Note   CSN: [id number] Arrival date & time: 10/08/15  1707  By signing my name below, I, Alyssa GroveMartin Green, attest that this documentation has been prepared under the direction and in the presence of Earley FavorGail Sanna Porcaro, NP. Electronically Signed: Alyssa GroveMartin Green, ED Scribe. 10/08/15. 8:42 PM.  History   Chief Complaint Chief Complaint  Patient presents with  . Headache  . Nausea  . Otalgia   The history is provided by the patient. No language interpreter was used.    HPI Comments: Paige Welch is a 25 y.o. female who presents to the Emergency Department complaining of gradual onset, constant headache for 1 week. She states experiencing headaches is not normal. Pain is localized to the areas surrounding her eyes. She states the pressure from her glasses exacerbates pain. Pt saw her PCP on 10/08/15 who gave her an injection with no relief to her symptoms. Pt reports associated light sensativity, right ear pain, and nausea. She states she has tried Tylenol, Goody's, and Advil with no relief to symptoms. Denies congestion, fever, rhinorrhea. She has seasonal allergies and regularly takes Allegra for symptoms relief. Pt has IUD in place.  Past Medical History:  Diagnosis Date  . No pertinent past medical history     There are no active problems to display for this patient.   Past Surgical History:  Procedure Laterality Date  . DILATION AND CURETTAGE OF UTERUS    . NOSE SURGERY    . WISDOM TOOTH EXTRACTION      OB History    Gravida Para Term Preterm AB Living   2 1 1  0 1 1   SAB TAB Ectopic Multiple Live Births   1 0 0 0 1       Home Medications    Prior to Admission medications   Medication Sig Start Date End Date Taking? Authorizing Provider  busPIRone (BUSPAR) 5 MG tablet Take 5 mg by mouth 3 (three) times daily.   Yes Historical Provider, MD  Butalbital-APAP-Caffeine 50-300-40 MG CAPS Take 1-2 tablets by mouth every 8 (eight) hours as needed (migraine).   10/06/15  Yes Historical Provider, MD  fexofenadine (ALLEGRA) 180 MG tablet Take 180 mg by mouth at bedtime.   Yes Historical Provider, MD  sertraline (ZOLOFT) 100 MG tablet Take 100 mg by mouth at bedtime.   Yes Historical Provider, MD  metoCLOPramide (REGLAN) 10 MG tablet Take 1 tablet (10 mg total) by mouth every 6 (six) hours as needed for nausea (headache). 10/08/15   Earley FavorGail Marvelle Span, NP    Family History Family History  Problem Relation Age of Onset  . Diabetes Maternal Aunt   . Heart disease Maternal Uncle   . Hypertension Maternal Grandmother   . Stroke Maternal Grandfather   . Anesthesia problems Neg Hx     Social History Social History  Substance Use Topics  . Smoking status: Never Smoker  . Smokeless tobacco: Never Used  . Alcohol use No     Allergies   Penicillins   Review of Systems Review of Systems  Constitutional: Negative for fever.  HENT: Positive for ear pain and sinus pressure. Negative for congestion, ear discharge, facial swelling and rhinorrhea.   Eyes: Positive for photophobia. Negative for pain, redness and visual disturbance.  Gastrointestinal: Positive for nausea.  Neurological: Positive for dizziness and headaches. Negative for weakness.  All other systems reviewed and are negative.    Physical Exam Updated Vital Signs BP 114/75 (BP Location: Left Arm)   Pulse 83  Temp 97.9 F (36.6 C) (Oral)   Resp 19   SpO2 100%   Physical Exam  Constitutional: She appears well-developed and well-nourished. She is active. No distress.  HENT:  Head: Normocephalic and atraumatic.  Right Ear: Tympanic membrane, external ear and ear canal normal. Tympanic membrane is not bulging.  Left Ear: Tympanic membrane, external ear and ear canal normal. Tympanic membrane is not bulging.  Mild to modearte tenderness in frontal sinuses No lymphadenopathy   Eyes: Conjunctivae are normal. Pupils are equal, round, and reactive to light.  Pupils dilate and accomadate  equally   Neck: Normal range of motion.  Cardiovascular: Normal rate.   Pulmonary/Chest: Effort normal. No respiratory distress.  Lymphadenopathy:    She has no cervical adenopathy.  Neurological: She is alert.  Skin: Skin is warm and dry.  Psychiatric: She has a normal mood and affect. Her behavior is normal.  Nursing note and vitals reviewed.   ED Treatments / Results  DIAGNOSTIC STUDIES: Oxygen Saturation is 100% on RA, normal by my interpretation.    COORDINATION OF CARE: 8:28 PM Discussed treatment plan with pt at bedside which includes urinalysis, IM Toradal and Reglan for symptoms relief and pt agreed to plan.  Labs (all labs ordered are listed, but only abnormal results are displayed) Labs Reviewed  URINALYSIS, ROUTINE W REFLEX MICROSCOPIC (NOT AT [facility]) - Abnormal; Notable for the following:       Result Value   Hgb urine dipstick TRACE (*)    Leukocytes, UA SMALL (*)    All other components within normal limits  URINE MICROSCOPIC-ADD ON - Abnormal; Notable for the following:    Squamous Epithelial / LPF 6-30 (*)    Bacteria, UA FEW (*)    All other components within normal limits    EKG  EKG Interpretation None       Radiology No results found.  Procedures Procedures (including critical care time)  Medications Ordered in ED Medications  metoCLOPramide (REGLAN) injection 10 mg (10 mg Intravenous Given 10/08/15 2059)  diphenhydrAMINE (BENADRYL) injection 25 mg (25 mg Intravenous Given 10/08/15 2102)  sodium chloride 0.9 % bolus 1,000 mL (1,000 mLs Intravenous New Bag/Given 10/08/15 2102)  ketorolac (TORADOL) 30 MG/ML injection 30 mg (30 mg Intravenous Given 10/08/15 2102)     Initial Impression / Assessment and Plan / ED Course  I have reviewed the triage vital signs and the nursing notes.  Pertinent labs & imaging results that were available during my care of the patient were reviewed by me and considered in my medical decision making (see chart for  details).  Clinical Course  I personally performed the services described in this documentation, which was scribed in my presence. The recorded information has been reviewed and is accurate. No specific cause of this patient's headache has been identified.  She did receive IV fluids, Toradol, Benadryl and Reglan with total resolution of her symptoms.  She has been given a prescription for Reglan with instructions to take this combination with ibuprofen and Benadryl for further headaches and follow-up with her primary care physician as needed     Final Clinical Impressions(s) / ED Diagnoses   Final diagnoses:  Bad headache    New Prescriptions New Prescriptions   METOCLOPRAMIDE (REGLAN) 10 MG TABLET    Take 1 tablet (10 mg total) by mouth every 6 (six) hours as needed for nausea (headache).     Earley Favor, NP 10/08/15 2305    Melene Plan, DO 10/08/15 2308

## 2015-10-08 NOTE — Discharge Instructions (Signed)
Your labs, urine were reviewed all within normal parameters.  He was given IV fluids, Toradol, which is an injectable form of ibuprofen, Reglan, which is for people with complex headaches as well as Benadryl U been given a prescription for Reglan 90, kidneys, if you have return of your headache and you can take over-the-counter Benadryl as well.  Follow up with your primary care physician as needed

## 2016-04-11 IMAGING — CR DG HAND COMPLETE 3+V*L*
3 series · 3 of 3 positions shown · non-contrast
Comparison: None.

CLINICAL DATA: 23-year-old female with left hand swelling, no known
injury. Initial encounter.

EXAM:
LEFT HAND - COMPLETE 3+ VIEW

[x hand pa left]
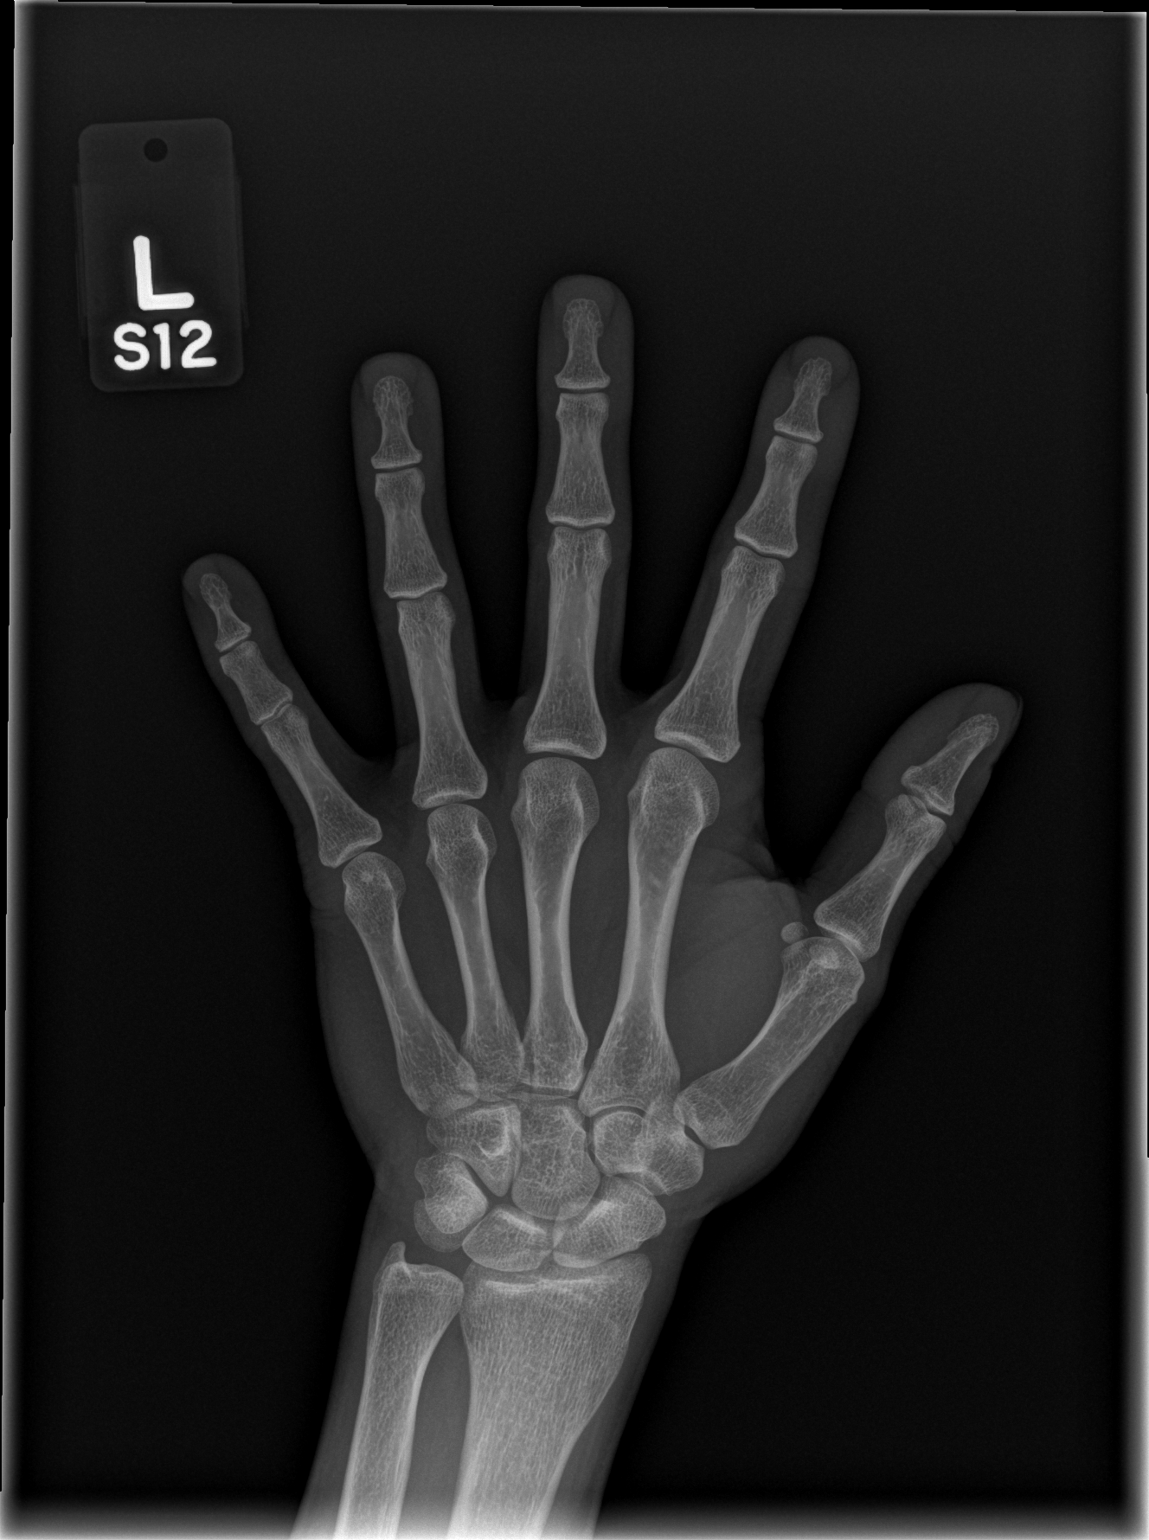

[x hand obl left]
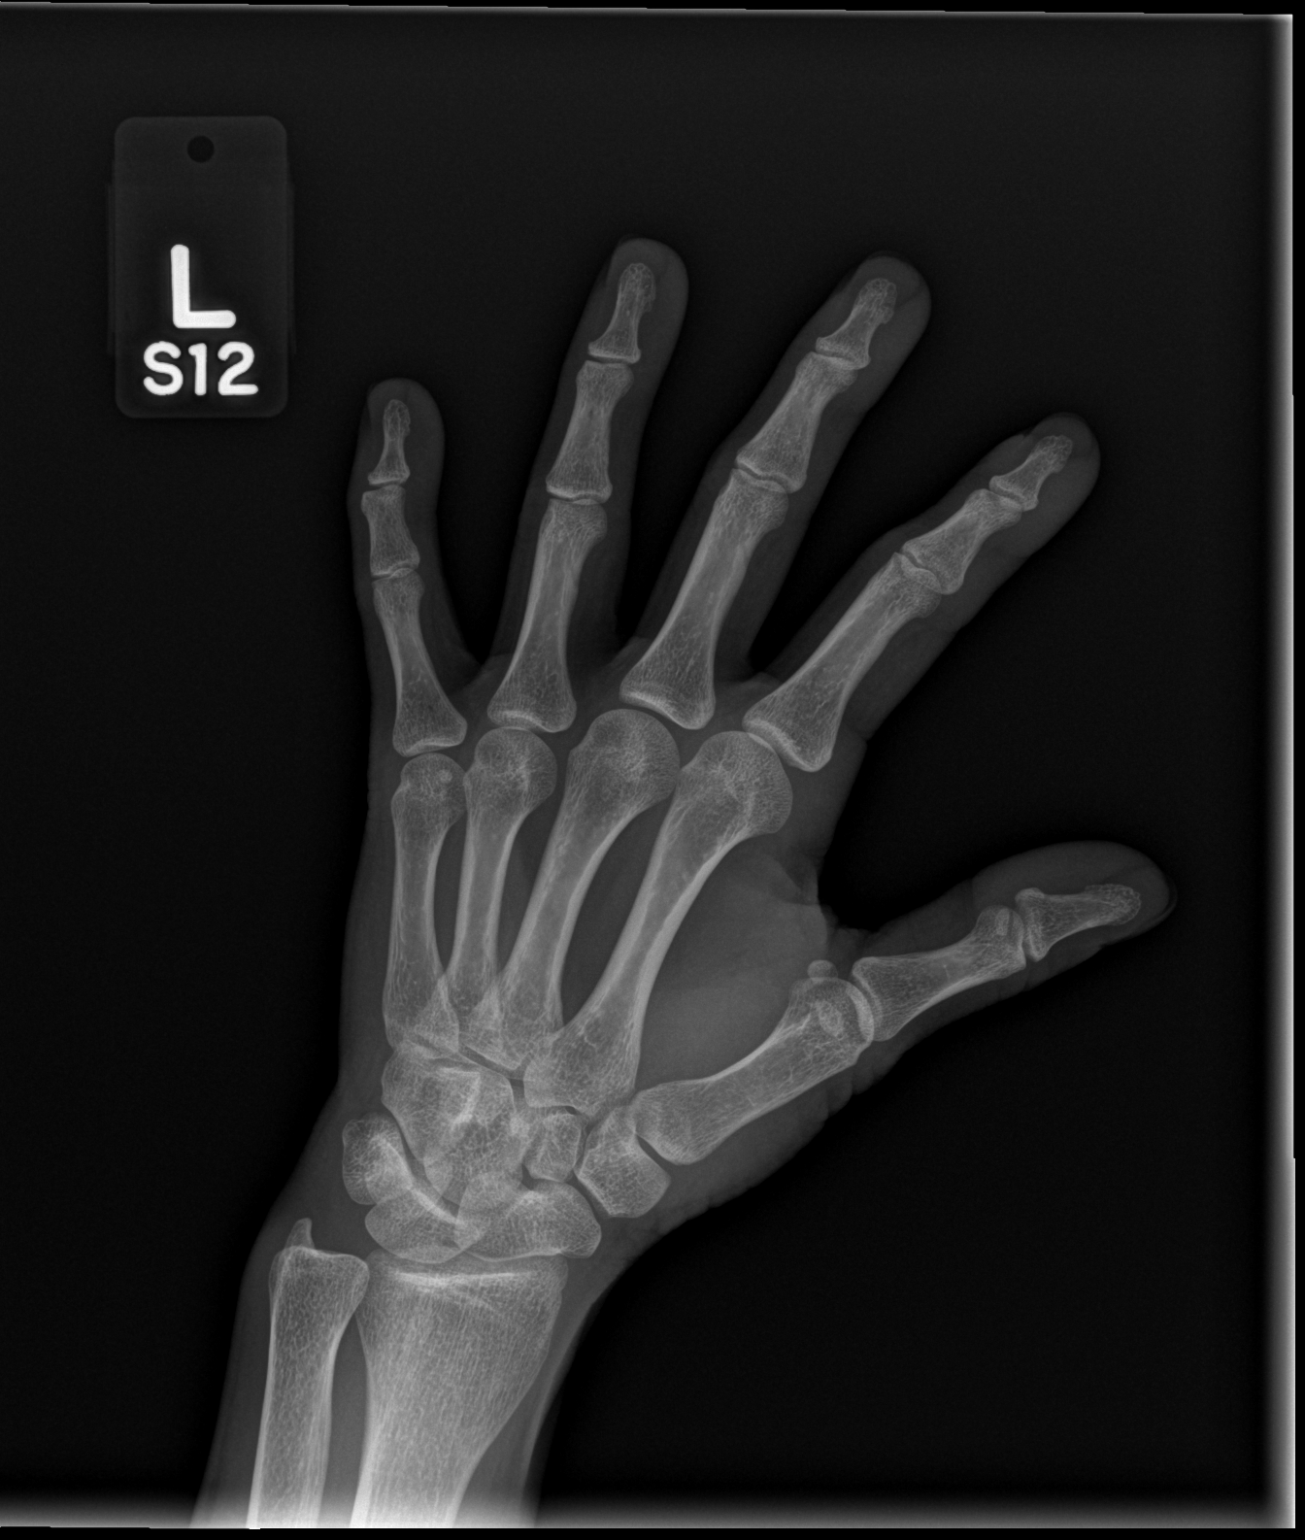

[x hand lat left]
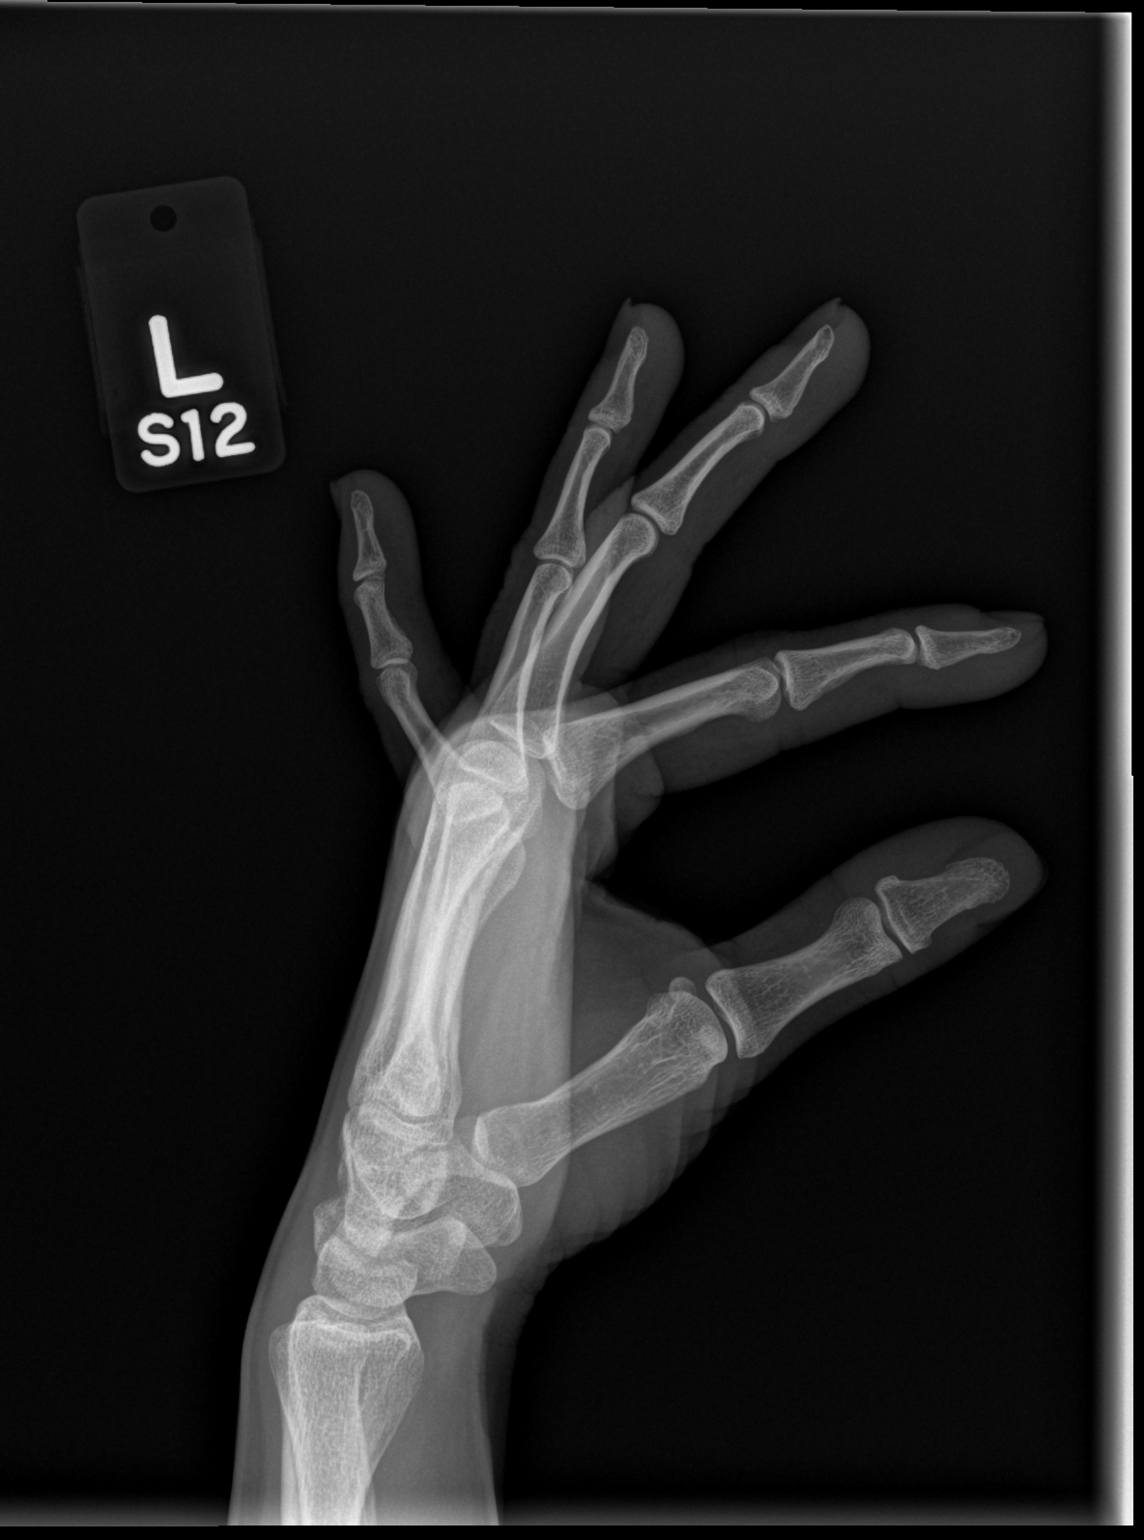

[3 of 3 positions shown; findings below may reference images not displayed]

FINDINGS: No radiopaque foreign body identified. No subcutaneous gas. Bone
mineralization is within normal limits. Distal radius and ulna
within normal limits. Carpal bone alignment preserved. Joint spaces
and alignment preserved. No fracture or dislocation.
IMPRESSION: Negative radiographic appearance of the left hand.

## 2017-01-17 NOTE — L&D Delivery Note (Signed)
Patient was C/C/+1 and pushed for  minutes with epidural.    NSVD female infant, Apgars 8/9, weight pending.   The patient had left labial abrasion not requiring suture. Fundus was firm. EBL was approx 500cc, firmed up but 800mcg cytotec rectally and 0.2 methergine IM given. Placenta was delivered intact. Vagina was clear.  Delayed cord clamping done for 30-60 seconds while warming baby. Baby was vigorous and doing skin to skin with mother.  Paige Welch, Paige Welch

## 2017-01-19 LAB — OB RESULTS CONSOLE HEPATITIS B SURFACE ANTIGEN
HEP B S AG: NEGATIVE
Hepatitis B Surface Ag: NEGATIVE

## 2017-01-19 LAB — OB RESULTS CONSOLE ABO/RH: RH Type: POSITIVE

## 2017-01-19 LAB — OB RESULTS CONSOLE GC/CHLAMYDIA
CHLAMYDIA, DNA PROBE: NEGATIVE
Chlamydia: NEGATIVE
Gonorrhea: NEGATIVE
Gonorrhea: NEGATIVE

## 2017-01-19 LAB — OB RESULTS CONSOLE RPR: RPR: NONREACTIVE

## 2017-01-19 LAB — OB RESULTS CONSOLE HIV ANTIBODY (ROUTINE TESTING)
HIV: NONREACTIVE
HIV: NONREACTIVE

## 2017-01-19 LAB — OB RESULTS CONSOLE ANTIBODY SCREEN: Antibody Screen: NEGATIVE

## 2017-01-19 LAB — OB RESULTS CONSOLE RUBELLA ANTIBODY, IGM
RUBELLA: IMMUNE
Rubella: IMMUNE

## 2017-05-17 ENCOUNTER — Encounter: Payer: Managed Care, Other (non HMO) | Attending: Obstetrics and Gynecology | Admitting: Registered"

## 2017-05-17 DIAGNOSIS — O99810 Abnormal glucose complicating pregnancy: Secondary | ICD-10-CM

## 2017-05-17 DIAGNOSIS — Z713 Dietary counseling and surveillance: Secondary | ICD-10-CM | POA: Diagnosis present

## 2017-05-19 ENCOUNTER — Encounter: Payer: Self-pay | Admitting: Registered"

## 2017-05-19 DIAGNOSIS — O99810 Abnormal glucose complicating pregnancy: Secondary | ICD-10-CM | POA: Insufficient documentation

## 2017-05-19 NOTE — Progress Notes (Signed)
Patient was seen on 05/17/17 for Gestational Diabetes self-management class at the [facility]. The following learning objectives were met by the patient during this course:   States the definition of Gestational Diabetes  States why dietary management is important in controlling blood glucose  Describes the effects each nutrient has on blood glucose levels  Demonstrates ability to create a balanced meal plan  Demonstrates carbohydrate counting   States when to check blood glucose levels  Demonstrates proper blood glucose monitoring techniques  States the effect of stress and exercise on blood glucose levels  States the importance of limiting caffeine and abstaining from alcohol and smoking  Blood glucose monitor given: Contour Next Lot # [id number] Exp: 06/17/2018 Blood glucose reading:   Patient instructed to monitor glucose levels: FBS: 60 - <95; 1 hour: <140; 2 hour: <120  Patient received handouts:  Nutrition Diabetes and Pregnancy, including carb counting list  Patient will be seen for follow-up as needed.

## 2017-06-11 ENCOUNTER — Encounter (HOSPITAL_COMMUNITY): Payer: Self-pay

## 2017-06-11 ENCOUNTER — Inpatient Hospital Stay (HOSPITAL_COMMUNITY)
Admission: AD | Admit: 2017-06-11 | Discharge: 2017-06-11 | Disposition: A | Discharge status: Home / Self Care | Payer: Managed Care, Other (non HMO) | Source: Ambulatory Visit | Attending: Obstetrics and Gynecology | Admitting: Obstetrics and Gynecology

## 2017-06-11 ENCOUNTER — Other Ambulatory Visit: Payer: Self-pay

## 2017-06-11 DIAGNOSIS — N898 Other specified noninflammatory disorders of vagina: Secondary | ICD-10-CM

## 2017-06-11 DIAGNOSIS — B379 Candidiasis, unspecified: Secondary | ICD-10-CM | POA: Diagnosis present

## 2017-06-11 DIAGNOSIS — O98813 Other maternal infectious and parasitic diseases complicating pregnancy, third trimester: Secondary | ICD-10-CM | POA: Diagnosis not present

## 2017-06-11 DIAGNOSIS — O4290 Premature rupture of membranes, unspecified as to length of time between rupture and onset of labor, unspecified weeks of gestation: Secondary | ICD-10-CM | POA: Diagnosis present

## 2017-06-11 DIAGNOSIS — Z3A33 33 weeks gestation of pregnancy: Secondary | ICD-10-CM | POA: Insufficient documentation

## 2017-06-11 DIAGNOSIS — Z9889 Other specified postprocedural states: Secondary | ICD-10-CM | POA: Diagnosis not present

## 2017-06-11 DIAGNOSIS — Z880 Allergy status to penicillin: Secondary | ICD-10-CM | POA: Diagnosis not present

## 2017-06-11 DIAGNOSIS — Z7984 Long term (current) use of oral hypoglycemic drugs: Secondary | ICD-10-CM | POA: Insufficient documentation

## 2017-06-11 DIAGNOSIS — Z79899 Other long term (current) drug therapy: Secondary | ICD-10-CM | POA: Insufficient documentation

## 2017-06-11 DIAGNOSIS — Z833 Family history of diabetes mellitus: Secondary | ICD-10-CM | POA: Insufficient documentation

## 2017-06-11 DIAGNOSIS — Z8249 Family history of ischemic heart disease and other diseases of the circulatory system: Secondary | ICD-10-CM | POA: Insufficient documentation

## 2017-06-11 DIAGNOSIS — O9989 Other specified diseases and conditions complicating pregnancy, childbirth and the puerperium: Secondary | ICD-10-CM

## 2017-06-11 LAB — WET PREP, GENITAL
Clue Cells Wet Prep HPF POC: NONE SEEN
Sperm: NONE SEEN
Trich, Wet Prep: NONE SEEN

## 2017-06-11 LAB — URINALYSIS, ROUTINE W REFLEX MICROSCOPIC
BILIRUBIN URINE: NEGATIVE
Glucose, UA: 50 mg/dL — AB
Ketones, ur: NEGATIVE mg/dL
NITRITE: NEGATIVE
PROTEIN: NEGATIVE mg/dL
Specific Gravity, Urine: 1.013 (ref 1.005–1.030)
pH: 6 (ref 5.0–8.0)

## 2017-06-11 LAB — AMNISURE RUPTURE OF MEMBRANE (ROM) NOT AT ARMC: Amnisure ROM: NEGATIVE

## 2017-06-11 LAB — POCT FERN TEST: POCT Fern Test: POSITIVE

## 2017-06-11 MED ORDER — FLUCONAZOLE 150 MG PO TABS: 150.0000 mg | ORAL_TABLET | Freq: Once | ORAL | 0 refills | Status: AC

## 2017-06-11 NOTE — MAU Provider Note (Signed)
Patient Paige Welch is a 26 y.o. G3P1011 At [redacted]w[redacted]d here with complaints of leaking of fluid. She denies vaginal bleeding, decreased fetal movement, contractions or other ob-gyn complaint.   She has a pregnancy complicated by gestational diabetes on glyburide.  History     CSN: [id number]  Arrival date and time: 06/11/17 2102   First Provider Initiated Contact with Patient 06/11/17 2153      Chief Complaint  Patient presents with  . Rupture of Membranes   Vaginal Discharge  The patient's primary symptoms include vaginal discharge. The current episode started yesterday. The problem occurs rarely. The problem has been resolved. The patient is experiencing no pain. Pertinent negatives include no constipation, diarrhea, urgency or vomiting. The vaginal discharge was watery. There has been no bleeding. She has not been passing clots. Nothing aggravates the symptoms. She has tried nothing for the symptoms.   [place] She took a shower around 5 pm, and then laid on her bed. She felt some discharge come out of her vagina; it made a stain on the bedspread that was about 5 x 5. It happened again a moment later. It has not repeated itself; she did not go to the hospital at the beach because she didn't want to. She came tonight instead to get checked out. Her next ob appt is on Tuesday.  OB History    Gravida  3   Para  1   Term  1   Preterm  0   AB  1   Living  1     SAB  1   TAB  0   Ectopic  0   Multiple  0   Live Births  1           Past Medical History:  Diagnosis Date  . No pertinent past medical history     Past Surgical History:  Procedure Laterality Date  . DILATION AND CURETTAGE OF UTERUS    . NOSE SURGERY    . WISDOM TOOTH EXTRACTION      Family History  Problem Relation Age of Onset  . Diabetes Maternal Aunt   . Heart disease Maternal Uncle   . Hypertension Maternal Grandmother   . Stroke Maternal Grandfather   .  Anesthesia problems Neg Hx     Social History   Tobacco Use  . Smoking status: Never Smoker  . Smokeless tobacco: Never Used  Substance Use Topics  . Alcohol use: No  . Drug use: No    Allergies:  Allergies  Allergen Reactions  . Penicillins Hives    Has patient had a PCN reaction causing immediate rash, facial/tongue/throat swelling, SOB or lightheadedness with hypotension:  yes Has patient had a PCN reaction causing severe rash involving mucus membranes or skin necrosis: no Has patient had a PCN reaction that required hospitalization: no Has patient had a PCN reaction occurring within the last 10 years: no If all of the above answers are "NO", then may proceed with Cephalosporin use.     Medications Prior to Admission  Medication Sig Dispense Refill Last Dose  . glyBURIDE (DIABETA) 2.5 MG tablet Take 2.5 mg by mouth daily with breakfast.   06/11/2017 at Unknown time  . loratadine (CLARITIN) 10 MG tablet Take 10 mg by mouth daily.   06/11/2017 at Unknown time  . metoCLOPramide (REGLAN) 10 MG tablet Take 1 tablet (10 mg total) by mouth every 6 (six) hours as needed for nausea (headache). 30  tablet 0 06/11/2017 at Unknown time  . Prenatal Vit-Fe Fumarate-FA (PRENATAL MULTIVITAMIN) TABS tablet Take 1 tablet by mouth daily at 12 noon.   06/11/2017 at Unknown time  . busPIRone (BUSPAR) 5 MG tablet Take 5 mg by mouth 3 (three) times daily.   Unknown at Unknown time  . Butalbital-APAP-Caffeine 50-300-40 MG CAPS Take 1-2 tablets by mouth every 8 (eight) hours as needed (migraine).    Given By EMS at Unknown time  . fexofenadine (ALLEGRA) 180 MG tablet Take 180 mg by mouth at bedtime.   Unknown at Unknown time  . sertraline (ZOLOFT) 100 MG tablet Take 100 mg by mouth at bedtime.   Unknown at Unknown time    Review of Systems  Constitutional: Negative.   HENT: Negative.   Respiratory: Negative.   Cardiovascular: Negative.   Gastrointestinal: Negative.  Negative for constipation, diarrhea  and vomiting.  Genitourinary: Positive for vaginal discharge. Negative for urgency.  Musculoskeletal: Negative.   Neurological: Negative.   Hematological: Negative.   Psychiatric/Behavioral: Negative.    Physical Exam   Blood pressure (!) 116/59, pulse 99, temperature (!) 97.4 F (36.3 C), temperature source Oral, resp. rate 18, height  (1.6 m), weight 182 lb (82.6 kg), last menstrual period 10/19/2016, SpO2 100 %, unknown if currently breastfeeding.  Physical Exam  Constitutional: She is oriented to person, place, and time. She appears well-developed.  HENT:  Head: Normocephalic.  GI: Soft.  Genitourinary:  Genitourinary Comments: Labia normal shape with no lesions; vaginal walls are reddened with discharge consistent with yeast. No pooling in the vagina. Cervix is long, close and posterior; no CMT, suprapubic or adnexal tenderness.   Neurological: She is alert and oriented to person, place, and time.  Skin: Skin is warm and dry.    MAU Course  Procedures  MDM -NST: 150 bpm, mod var, present acel, neg decels. One uterine contraction.  -Initial fern was positive, although may have been false positive due to high concentration of cervical mucous and yeast. However, no pooling in the vagina. Amnisure was negative.  -wet prep shows yeast   Discussed NST, lab findings, patient's physical exam with Dr. Claiborne Billings, who agrees that patient is stable for discharge.  Assessment and Plan   1. Yeast infection    2. Negative amnisure  3. Will discharge home with RX for Diflucan and recommendation to keep her prenatal visit on Tuesday.   4. Reviewed warning signs of pregnancy and when to return to the MAU.  Charlesetta Garibaldi Johnae Friley 06/11/2017, 9:56 PM

## 2017-06-11 NOTE — Discharge Instructions (Signed)

## 2017-06-11 NOTE — MAU Note (Signed)
Pt states that she felt a gush of fluid leak @ 1600 while laying in bed. Denies any further leaking. + FM

## 2017-06-13 LAB — GC/CHLAMYDIA PROBE AMP (~~LOC~~) NOT AT ARMC
Chlamydia: NEGATIVE
Neisseria Gonorrhea: NEGATIVE

## 2017-06-22 LAB — OB RESULTS CONSOLE GBS: STREP GROUP B AG: NEGATIVE

## 2017-06-24 ENCOUNTER — Encounter (HOSPITAL_COMMUNITY): Payer: Self-pay

## 2017-06-24 ENCOUNTER — Inpatient Hospital Stay (HOSPITAL_COMMUNITY)
Admission: AD | Admit: 2017-06-24 | Discharge: 2017-06-24 | Disposition: A | Discharge status: Home / Self Care | Payer: Managed Care, Other (non HMO) | Source: Ambulatory Visit | Attending: Obstetrics and Gynecology | Admitting: Obstetrics and Gynecology

## 2017-06-24 DIAGNOSIS — Z880 Allergy status to penicillin: Secondary | ICD-10-CM | POA: Insufficient documentation

## 2017-06-24 DIAGNOSIS — Z3A35 35 weeks gestation of pregnancy: Secondary | ICD-10-CM | POA: Diagnosis not present

## 2017-06-24 DIAGNOSIS — O26893 Other specified pregnancy related conditions, third trimester: Secondary | ICD-10-CM | POA: Diagnosis present

## 2017-06-24 DIAGNOSIS — L299 Pruritus, unspecified: Secondary | ICD-10-CM | POA: Insufficient documentation

## 2017-06-24 DIAGNOSIS — O9989 Other specified diseases and conditions complicating pregnancy, childbirth and the puerperium: Secondary | ICD-10-CM

## 2017-06-24 LAB — URINALYSIS, ROUTINE W REFLEX MICROSCOPIC
BILIRUBIN URINE: NEGATIVE
Glucose, UA: >=500 mg/dL — AB
Hgb urine dipstick: NEGATIVE
KETONES UR: NEGATIVE mg/dL
Nitrite: NEGATIVE
PH: 7 (ref 5.0–8.0)
Protein, ur: NEGATIVE mg/dL
Specific Gravity, Urine: 1.011 (ref 1.005–1.030)

## 2017-06-24 LAB — CBC WITH DIFFERENTIAL/PLATELET
BASOS PCT: 0 %
Basophils Absolute: 0 10*3/uL (ref 0.0–0.1)
Eosinophils Absolute: 0.2 10*3/uL (ref 0.0–0.7)
Eosinophils Relative: 2 %
HCT: 31.5 % — ABNORMAL LOW (ref 36.0–46.0)
Hemoglobin: 10.6 g/dL — ABNORMAL LOW (ref 12.0–15.0)
Lymphocytes Relative: 20 %
Lymphs Abs: 1.7 10*3/uL (ref 0.7–4.0)
MCH: 30.3 pg (ref 26.0–34.0)
MCHC: 33.7 g/dL (ref 30.0–36.0)
MCV: 90 fL (ref 78.0–100.0)
MONO ABS: 0.4 10*3/uL (ref 0.1–1.0)
MONOS PCT: 5 %
Neutro Abs: 6.3 10*3/uL (ref 1.7–7.7)
Neutrophils Relative %: 73 %
PLATELETS: 264 10*3/uL (ref 150–400)
RBC: 3.5 MIL/uL — ABNORMAL LOW (ref 3.87–5.11)
RDW: 13.7 % (ref 11.5–15.5)
WBC: 8.6 10*3/uL (ref 4.0–10.5)

## 2017-06-24 LAB — COMPREHENSIVE METABOLIC PANEL
ALBUMIN: 2.6 g/dL — AB (ref 3.5–5.0)
ALT: 17 U/L (ref 14–54)
ANION GAP: 9 (ref 5–15)
AST: 18 U/L (ref 15–41)
Alkaline Phosphatase: 148 U/L — ABNORMAL HIGH (ref 38–126)
BILIRUBIN TOTAL: 0.5 mg/dL (ref 0.3–1.2)
BUN: 6 mg/dL (ref 6–20)
CHLORIDE: 110 mmol/L (ref 101–111)
CO2: 19 mmol/L — AB (ref 22–32)
Calcium: 8.6 mg/dL — ABNORMAL LOW (ref 8.9–10.3)
Creatinine, Ser: 0.57 mg/dL (ref 0.44–1.00)
GFR calc Af Amer: >60 mL/min (ref >60)
GFR calc non Af Amer: >60 mL/min (ref >60)
GLUCOSE: 160 mg/dL — AB (ref 65–99)
POTASSIUM: 3.7 mmol/L (ref 3.5–5.1)
SODIUM: 138 mmol/L (ref 135–145)
TOTAL PROTEIN: 6.6 g/dL (ref 6.5–8.1)

## 2017-06-24 MED ORDER — HYDROXYZINE PAMOATE 25 MG PO CAPS: 25.0000 mg | ORAL_CAPSULE | Freq: Three times a day (TID) | ORAL | 0 refills | Status: DC

## 2017-06-24 NOTE — MAU Provider Note (Signed)
History     CSN: [id number]  Arrival date and time: 06/24/17 1024   First Provider Initiated Contact with Patient 06/24/17 1053      Chief Complaint  Patient presents with  . Pruritis   HPI Ms. Paige Welch is a 27 y.o. G3P1011 at [redacted]w[redacted]d who presents to MAU today with complaint of all over itching without rash. This has been present for 1-2 weeks, but worsening today. She reports normal fetal movement and denies contractions, vaginal bleeding or LOF. She has GDM with this pregnancy on Glyburide.   OB History    Gravida  3   Para  1   Term  1   Preterm  0   AB  1   Living  1     SAB  1   TAB  0   Ectopic  0   Multiple  0   Live Births  1           Past Medical History:  Diagnosis Date  . No pertinent past medical history     Past Surgical History:  Procedure Laterality Date  . DILATION AND CURETTAGE OF UTERUS    . NOSE SURGERY    . WISDOM TOOTH EXTRACTION      Family History  Problem Relation Age of Onset  . Diabetes Maternal Aunt   . Heart disease Maternal Uncle   . Hypertension Maternal Grandmother   . Stroke Maternal Grandfather   . Anesthesia problems Neg Hx     Social History   Tobacco Use  . Smoking status: Never Smoker  . Smokeless tobacco: Never Used  Substance Use Topics  . Alcohol use: No  . Drug use: No    Allergies:  Allergies  Allergen Reactions  . Penicillins Hives    Has patient had a PCN reaction causing immediate rash, facial/tongue/throat swelling, SOB or lightheadedness with hypotension:  yes Has patient had a PCN reaction causing severe rash involving mucus membranes or skin necrosis: no Has patient had a PCN reaction that required hospitalization: no Has patient had a PCN reaction occurring within the last 10 years: no If all of the above answers are "NO", then may proceed with Cephalosporin use.     No medications prior to admission.    Review of Systems  Constitutional: Negative for fever.   Gastrointestinal: Negative for abdominal pain.  Genitourinary: Negative for vaginal bleeding and vaginal discharge.       + itching  Skin: Negative for rash.   Physical Exam   Blood pressure (!) 122/58, pulse 95, temperature 98.1 F (36.7 C), temperature source Oral, resp. rate 18, height 5\' 4"  (1.626 m), weight 183 lb 12 oz (83.3 kg), last menstrual period 10/19/2016, unknown if currently breastfeeding.  Physical Exam  Nursing note and vitals reviewed. Constitutional: She is oriented to person, place, and time. She appears well-developed and well-nourished. No distress.  HENT:  Head: Normocephalic and atraumatic.  Cardiovascular: Normal rate.  Respiratory: Effort normal.  GI: Soft. She exhibits no distension and no mass. There is no tenderness. There is no rebound and no guarding.  Neurological: She is alert and oriented to person, place, and time.  Skin: Skin is warm and dry. No rash noted. No erythema.  Few excoriations noted  Psychiatric: She has a normal mood and affect.   Results for orders placed or performed during the hospital encounter of 06/24/17 (from the past 24 hour(s))  Urinalysis, Routine w reflex microscopic     Status: Abnormal  Collection Time: 06/24/17 10:31 AM  Result Value Ref Range   Color, Urine YELLOW YELLOW   APPearance CLOUDY (A) CLEAR   Specific Gravity, Urine 1.011 1.005 - 1.030   pH 7.0 5.0 - 8.0   Glucose, UA >=500 (A) NEGATIVE mg/dL   Hgb urine dipstick NEGATIVE NEGATIVE   Bilirubin Urine NEGATIVE NEGATIVE   Ketones, ur NEGATIVE NEGATIVE mg/dL   Protein, ur NEGATIVE NEGATIVE mg/dL   Nitrite NEGATIVE NEGATIVE   Leukocytes, UA SMALL (A) NEGATIVE   RBC / HPF 0-5 0 - 5 RBC/hpf   WBC, UA 6-10 0 - 5 WBC/hpf   Bacteria, UA FEW (A) NONE SEEN   Squamous Epithelial / LPF 21-50 0 - 5   Mucus PRESENT    Budding Yeast PRESENT   CBC with Differential/Platelet     Status: Abnormal   Collection Time: 06/24/17 11:10 AM  Result Value Ref Range   WBC 8.6  4.0 - 10.5 K/uL   RBC 3.50 (L) 3.87 - 5.11 MIL/uL   Hemoglobin 10.6 (L) 12.0 - 15.0 g/dL   HCT 40.931.5 (L) 81.136.0 - 91.446.0 %   MCV 90.0 78.0 - 100.0 fL   MCH 30.3 26.0 - 34.0 pg   MCHC 33.7 30.0 - 36.0 g/dL   RDW 78.213.7 95.611.5 - 21.315.5 %   Platelets 264 150 - 400 K/uL   Neutrophils Relative % 73 %   Neutro Abs 6.3 1.7 - 7.7 K/uL   Lymphocytes Relative 20 %   Lymphs Abs 1.7 0.7 - 4.0 K/uL   Monocytes Relative 5 %   Monocytes Absolute 0.4 0.1 - 1.0 K/uL   Eosinophils Relative 2 %   Eosinophils Absolute 0.2 0.0 - 0.7 K/uL   Basophils Relative 0 %   Basophils Absolute 0.0 0.0 - 0.1 K/uL  Comprehensive metabolic panel     Status: Abnormal   Collection Time: 06/24/17 11:10 AM  Result Value Ref Range   Sodium 138 135 - 145 mmol/L   Potassium 3.7 3.5 - 5.1 mmol/L   Chloride 110 101 - 111 mmol/L   CO2 19 (L) 22 - 32 mmol/L   Glucose, Bld 160 (H) 65 - 99 mg/dL   BUN 6 6 - 20 mg/dL   Creatinine, Ser 0.860.57 0.44 - 1.00 mg/dL   Calcium 8.6 (L) 8.9 - 10.3 mg/dL   Total Protein 6.6 6.5 - 8.1 g/dL   Albumin 2.6 (L) 3.5 - 5.0 g/dL   AST 18 15 - 41 U/L   ALT 17 14 - 54 U/L   Alkaline Phosphatase 148 (H) 38 - 126 U/L   Total Bilirubin 0.5 0.3 - 1.2 mg/dL   GFR calc non Af Amer >60 >60 mL/min   GFR calc Af Amer >60 >60 mL/min   Anion gap 9 5 - 15   Fetal Monitoring: Baseline: 150 bpm Variability: moderate Accelerations: 15 x 15 Decelerations: none Contractions: none   MAU Course  Procedures None  MDM CBC, CMP and Bile acids drawn Patient driving, will prescribe Vistaril for itching rather than trial in MAU  Discussed with Dr. Dareen PianoAnderson, agrees with plan Bile acids pending, will call on-call provider with abnormal results when final  Assessment and Plan  A: SIUP at 7867w3d Pruritis   P: Discharge home Rx for Vistaril sent to patient's pharmacy Advised can take Claritin during the day for less sedating itch relief   Warning signs for worsening condition discussed Patient advised to  [facility] for routine prenatal  care or call sooner if symptoms worsen  Patient may return to MAU as needed or if her condition were to change or worsen  Vonzella Nipple, PA-C 06/24/2017, 11:51 AM

## 2017-06-24 NOTE — MAU Note (Signed)
Reports whole body itches, ongoing for the past couple weeks but is getting worse.

## 2017-06-24 NOTE — Discharge Instructions (Signed)
Pruritus Pruritus is an itching feeling. There are many different conditions and factors that can make your skin itchy. Dry skin is one of the most common causes of itching. Most cases of itching do not require medical attention. Itchy skin can turn into a rash. Follow these instructions at home: Watch your pruritus for any changes. Take these steps to help with your condition: Skin Care  Moisturize your skin as needed. A moisturizer that contains petroleum jelly is best for keeping moisture in your skin.  Take or apply medicines only as directed by your health care provider. This may include: ? Corticosteroid cream. ? Anti-itch lotions. ? Oral anti-histamines.  Apply cool compresses to the affected areas.  Try taking a bath with: ? Epsom salts. Follow the instructions on the packaging. You can get these at your local pharmacy or grocery store. ? Baking soda. Pour a small amount into the bath as directed by your health care provider. ? Colloidal oatmeal. Follow the instructions on the packaging. You can get this at your local pharmacy or grocery store.  Try applying baking soda paste to your skin. Stir water into baking soda until it reaches a paste-like consistency.  Do not scratch your skin.  Avoid hot showers or baths, which can make itching worse. A cold shower may help with itching as long as you use a moisturizer after.  Avoid scented soaps, detergents, and perfumes. Use gentle soaps, detergents, perfumes, and other cosmetic products. General instructions  Avoid wearing tight clothes.  Keep a journal to help track what causes your itch. Write down: ? What you eat. ? What cosmetic products you use. ? What you drink. ? What you wear. This includes jewelry.  Use a humidifier. This keeps the air moist, which helps to prevent dry skin. Contact a health care provider if:  The itching does not go away after several days.  You sweat at night.  You have weight loss.  You  are unusually thirsty.  You urinate more than normal.  You are more tired than normal.  You have abdominal pain.  Your skin tingles.  You feel weak.  Your skin or the whites of your eyes look yellow (jaundice).  Your skin feels numb. This information is not intended to replace advice given to you by your health care provider. Make sure you discuss any questions you have with your health care provider. Document Released: 09/15/2010 Document Revised: 06/11/2015 Document Reviewed: 12/30/2013 Elsevier Interactive Patient Education  2018 [employer].  Cholestasis of Pregnancy Cholestasis refers to any condition that causes the flow of digestive fluid (bile) produced by the liver to slow down or stop. Cholestasis of pregnancy is most common toward the end of pregnancy (thirdtrimester), but it can occur any time during pregnancy. The condition often goes away soon after giving birth. Cholestasis may be uncomfortable, but it is usually harmless to you. However, it can be harmful to your baby. Cholestasis may increase the risk of:  Your baby being born too early (preterm delivery).  Your baby having a slow heart rate and lack of oxygen during delivery (fetal distress).  Losing your baby before delivery (stillbirth).  What are the causes? The exact cause of this condition is not known, but it may be related to:  Pregnancy hormones. The gallbladder normally holds bile until you need it to help digest fat in your diet. Pregnancy hormones may cause the flow of bile to slow down and back up into your liver. Bile may then get  into your bloodstream and cause cholestasis symptoms.  Changes in your genes (genetic mutations). Specifically, genes that affect how the liver releases bile.  What increases the risk? You are more likely to develop this condition if:  You had cholestasis during a previous pregnancy.  You have a family history of cholestasis.  You have liver problems.  You are  having multiple babies, such as twins or triplets.  What are the signs or symptoms? The most common symptom of this condition is intense itching (pruritus), especially on the palms of your hands and soles of your feet. The itching can spread to the rest of your body and is often worse at night. You will not usually have a rash. Other symptoms may include:  Feeling tired.  Pain in your upper right abdomen.  Dark-colored urine.  Light-colored stools.  Poor appetite.  Yellowish discoloration of your skin and the whites of your eyes (jaundice).  How is this diagnosed? This condition is diagnosed based on:  Your medical history.  A physical exam.  Blood tests.  If you have an inherited risk for developing this condition, you may also have genetic testing. How is this treated? The goal of treatment is to make you comfortable and keep your baby safe. Your health care provider may:  Prescribe medicine to reduce bile acid in your bloodstream, relieve symptoms, and help keep your baby safe.  Give you vitamin K before delivery to prevent excessive bleeding.  Check your baby frequently (fetal monitoring).  Perform regular blood tests to check your bile levels and liver function until your baby is delivered.  Recommend starting (inducing) your labor and delivery by week 36 or 37 of pregnancy, or as soon as your baby's lungs have developed enough.  Follow these instructions at home:  Take over-the-counter and prescription medicines only as told by your health care provider.  Take cool baths to soothe itchy skin.  Wear comfortable, loose-fitting, cotton clothing to reduce itching.  Keep your fingernails short to prevent skin irritation from scratching.  Keep all follow-up visits and prenatal visits as told by your health care provider. This is important. Contact a health care provider if:  Your symptoms get worse, even with treatment.  You develop pain in your right  side.  You have unusual swelling in your abdomen, feet, ankles, or legs.  You have a fever.  You are more thirsty than usual. Get help right away if:  You go into early labor.  You have a headache that does not go away or causes changes in vision.  You have nausea or you vomit.  You have severe pain in your abdomen or shoulders.  You have shortness of breath. Summary  Cholestasis of pregnancy is most common toward the end of pregnancy (thirdtrimester), but it can occur any time during your pregnancy.  The condition often goes away soon after your baby is born.  The most common symptom of cholestasis of pregnancy is intense itching (pruritus), especially on the palms of your hands and soles of your feet.  This condition may be treated with medicine, frequent monitoring, or starting (inducing) labor and delivery by week 36 or 37 of pregnancy. This information is not intended to replace advice given to you by your health care provider. Make sure you discuss any questions you have with your health care provider. Document Released: 01/01/2000 Document Revised: 12/19/2015 Document Reviewed: 12/19/2015 Elsevier Interactive Patient Education  2017 [employer]

## 2017-06-26 LAB — BILE ACIDS, TOTAL: BILE ACIDS TOTAL: 9.5 umol/L (ref 4.7–24.5)

## 2017-06-29 ENCOUNTER — Other Ambulatory Visit: Payer: Self-pay | Admitting: Obstetrics and Gynecology

## 2017-06-29 ENCOUNTER — Telehealth (HOSPITAL_COMMUNITY): Payer: Self-pay | Admitting: *Deleted

## 2017-06-29 ENCOUNTER — Encounter (HOSPITAL_COMMUNITY): Payer: Self-pay | Admitting: *Deleted

## 2017-06-29 NOTE — Telephone Encounter (Signed)
Preadmission screen  

## 2017-06-30 ENCOUNTER — Encounter (HOSPITAL_COMMUNITY): Payer: Self-pay

## 2017-06-30 ENCOUNTER — Inpatient Hospital Stay (HOSPITAL_COMMUNITY)
Admission: AD | Admit: 2017-06-30 | Discharge: 2017-07-01 | Disposition: A | Discharge status: Home / Self Care | Payer: No Typology Code available for payment source | Source: Ambulatory Visit | Attending: Obstetrics | Admitting: Obstetrics

## 2017-06-30 DIAGNOSIS — M545 Low back pain: Secondary | ICD-10-CM | POA: Diagnosis not present

## 2017-06-30 DIAGNOSIS — O26613 Liver and biliary tract disorders in pregnancy, third trimester: Secondary | ICD-10-CM | POA: Insufficient documentation

## 2017-06-30 DIAGNOSIS — K831 Obstruction of bile duct: Secondary | ICD-10-CM | POA: Insufficient documentation

## 2017-06-30 DIAGNOSIS — Z79899 Other long term (current) drug therapy: Secondary | ICD-10-CM | POA: Insufficient documentation

## 2017-06-30 DIAGNOSIS — Z3A36 36 weeks gestation of pregnancy: Secondary | ICD-10-CM | POA: Insufficient documentation

## 2017-06-30 DIAGNOSIS — Z3A26 26 weeks gestation of pregnancy: Secondary | ICD-10-CM | POA: Diagnosis not present

## 2017-06-30 DIAGNOSIS — O9989 Other specified diseases and conditions complicating pregnancy, childbirth and the puerperium: Secondary | ICD-10-CM

## 2017-06-30 DIAGNOSIS — Z041 Encounter for examination and observation following transport accident: Secondary | ICD-10-CM | POA: Insufficient documentation

## 2017-06-30 DIAGNOSIS — D693 Immune thrombocytopenic purpura: Secondary | ICD-10-CM | POA: Insufficient documentation

## 2017-06-30 DIAGNOSIS — O99113 Other diseases of the blood and blood-forming organs and certain disorders involving the immune mechanism complicating pregnancy, third trimester: Secondary | ICD-10-CM | POA: Insufficient documentation

## 2017-06-30 DIAGNOSIS — O24419 Gestational diabetes mellitus in pregnancy, unspecified control: Secondary | ICD-10-CM | POA: Insufficient documentation

## 2017-06-30 NOTE — MAU Note (Signed)
Urine in lab 
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
## 2017-07-01 DIAGNOSIS — M545 Low back pain: Secondary | ICD-10-CM | POA: Diagnosis not present

## 2017-07-01 DIAGNOSIS — O9989 Other specified diseases and conditions complicating pregnancy, childbirth and the puerperium: Secondary | ICD-10-CM | POA: Diagnosis not present

## 2017-07-01 DIAGNOSIS — K831 Obstruction of bile duct: Secondary | ICD-10-CM | POA: Diagnosis not present

## 2017-07-01 DIAGNOSIS — Z3A36 36 weeks gestation of pregnancy: Secondary | ICD-10-CM | POA: Diagnosis not present

## 2017-07-01 DIAGNOSIS — D693 Immune thrombocytopenic purpura: Secondary | ICD-10-CM | POA: Diagnosis not present

## 2017-07-01 DIAGNOSIS — Z041 Encounter for examination and observation following transport accident: Secondary | ICD-10-CM | POA: Diagnosis present

## 2017-07-01 DIAGNOSIS — O26613 Liver and biliary tract disorders in pregnancy, third trimester: Secondary | ICD-10-CM | POA: Diagnosis not present

## 2017-07-01 DIAGNOSIS — O24419 Gestational diabetes mellitus in pregnancy, unspecified control: Secondary | ICD-10-CM | POA: Diagnosis not present

## 2017-07-01 DIAGNOSIS — O99113 Other diseases of the blood and blood-forming organs and certain disorders involving the immune mechanism complicating pregnancy, third trimester: Secondary | ICD-10-CM | POA: Diagnosis not present

## 2017-07-01 DIAGNOSIS — Z3A26 26 weeks gestation of pregnancy: Secondary | ICD-10-CM | POA: Diagnosis not present

## 2017-07-01 DIAGNOSIS — Z79899 Other long term (current) drug therapy: Secondary | ICD-10-CM | POA: Diagnosis not present

## 2017-07-01 MED ORDER — DIPHENHYDRAMINE HCL 25 MG PO CAPS
25.0000 mg | ORAL_CAPSULE | Freq: Once | ORAL | Status: AC
Start: 2017-07-01 — End: 2017-07-01
  Administered 2017-07-01: 25 mg via ORAL
  Filled 2017-07-01: qty 1

## 2017-07-01 NOTE — Discharge Instructions (Signed)
Third Trimester of Pregnancy The third trimester is from week 28 through week 40 (months 7 through 9). The third trimester is a time when the unborn baby (fetus) is growing rapidly. At the end of the ninth month, the fetus is about 20 inches in length and weighs 6-10 pounds. Body changes during your third trimester Your body will continue to go through many changes during pregnancy. The changes vary from woman to woman. During the third trimester:  Your weight will continue to increase. You can expect to gain 25-35 pounds (11-16 kg) by the end of the pregnancy.  You may begin to get stretch marks on your hips, abdomen, and breasts.  You may urinate more often because the fetus is moving lower into your pelvis and pressing on your bladder.  You may develop or continue to have heartburn. This is caused by increased hormones that slow down muscles in the digestive tract.  You may develop or continue to have constipation because increased hormones slow digestion and cause the muscles that push waste through your intestines to relax.  You may develop hemorrhoids. These are swollen veins (varicose veins) in the rectum that can itch or be painful.  You may develop swollen, bulging veins (varicose veins) in your legs.  You may have increased body aches in the pelvis, back, or thighs. This is due to weight gain and increased hormones that are relaxing your joints.  You may have changes in your hair. These can include thickening of your hair, rapid growth, and changes in texture. Some women also have hair loss during or after pregnancy, or hair that feels dry or thin. Your hair will most likely return to normal after your baby is born.  Your breasts will continue to grow and they will continue to become tender. A yellow fluid (colostrum) may leak from your breasts. This is the first milk you are producing for your baby.  Your belly button may stick out.  You may notice more swelling in your hands,  face, or ankles.  You may have increased tingling or numbness in your hands, arms, and legs. The skin on your belly may also feel numb.  You may feel short of breath because of your expanding uterus.  You may have more problems sleeping. This can be caused by the size of your belly, increased need to urinate, and an increase in your body's metabolism.  You may notice the fetus "dropping," or moving lower in your abdomen (lightening).  You may have increased vaginal discharge.  You may notice your joints feel loose and you may have pain around your pelvic bone.  What to expect at prenatal visits You will have prenatal exams every 2 weeks until week 36. Then you will have weekly prenatal exams. During a routine prenatal visit:  You will be weighed to make sure you and the baby are growing normally.  Your blood pressure will be taken.  Your abdomen will be measured to track your baby's growth.  The fetal heartbeat will be listened to.  Any test results from the previous visit will be discussed.  You may have a cervical check near your due date to see if your cervix has softened or thinned (effaced).  You will be tested for Group B streptococcus. This happens between 35 and 37 weeks.  Your health care provider may ask you:  What your birth plan is.  How you are feeling.  If you are feeling the baby move.  If you have had   any abnormal symptoms, such as leaking fluid, bleeding, severe headaches, or abdominal cramping.  If you are using any tobacco products, including cigarettes, chewing tobacco, and electronic cigarettes.  If you have any questions.  Other tests or screenings that may be performed during your third trimester include:  Blood tests that check for low iron levels (anemia).  Fetal testing to check the health, activity level, and growth of the fetus. Testing is done if you have certain medical conditions or if there are problems during the  pregnancy.  Nonstress test (NST). This test checks the health of your baby to make sure there are no signs of problems, such as the baby not getting enough oxygen. During this test, a belt is placed around your belly. The baby is made to move, and its heart rate is monitored during movement.  What is false labor? False labor is a condition in which you feel small, irregular tightenings of the muscles in the womb (contractions) that usually go away with rest, changing position, or drinking water. These are called Braxton Hicks contractions. Contractions may last for hours, days, or even weeks before true labor sets in. If contractions come at regular intervals, become more frequent, increase in intensity, or become painful, you should see your health care provider. What are the signs of labor?  Abdominal cramps.  Regular contractions that start at 10 minutes apart and become stronger and more frequent with time.  Contractions that start on the top of the uterus and spread down to the lower abdomen and back.  Increased pelvic pressure and dull back pain.  A watery or bloody mucus discharge that comes from the vagina.  Leaking of amniotic fluid. This is also known as your "water breaking." It could be a slow trickle or a gush. Let your health care provider know if it has a color or strange odor. If you have any of these signs, call your health care provider right away, even if it is before your due date. Follow these instructions at home: Medicines  Follow your health care provider's instructions regarding medicine use. Specific medicines may be either safe or unsafe to take during pregnancy.  Take a prenatal vitamin that contains at least 600 micrograms (mcg) of folic acid.  If you develop constipation, try taking a stool softener if your health care provider approves. Eating and drinking  Eat a balanced diet that includes fresh fruits and vegetables, whole grains, good sources of protein  such as meat, eggs, or tofu, and low-fat dairy. Your health care provider will help you determine the amount of weight gain that is right for you.  Avoid raw meat and uncooked cheese. These carry germs that can cause birth defects in the baby.  If you have low calcium intake from food, talk to your health care provider about whether you should take a daily calcium supplement.  Eat four or five small meals rather than three large meals a day.  Limit foods that are high in fat and processed sugars, such as fried and sweet foods.  To prevent constipation: ? Drink enough fluid to keep your urine clear or pale yellow. ? Eat foods that are high in fiber, such as fresh fruits and vegetables, whole grains, and beans. Activity  Exercise only as directed by your health care provider. Most women can continue their usual exercise routine during pregnancy. Try to exercise for 30 minutes at least 5 days a week. Stop exercising if you experience uterine contractions.  Avoid heavy   lifting.  Do not exercise in extreme heat or humidity, or at high altitudes.  Wear low-heel, comfortable shoes.  Practice good posture.  You may continue to have sex unless your health care provider tells you otherwise. Relieving pain and discomfort  Take frequent breaks and rest with your legs elevated if you have leg cramps or low back pain.  Take warm sitz baths to soothe any pain or discomfort caused by hemorrhoids. Use hemorrhoid cream if your health care provider approves.  Wear a good support bra to prevent discomfort from breast tenderness.  If you develop varicose veins: ? Wear support pantyhose or compression stockings as told by your healthcare provider. ? Elevate your feet for 15 minutes, 3-4 times a day. Prenatal care  Write down your questions. Take them to your prenatal visits.  Keep all your prenatal visits as told by your health care provider. This is important. Safety  Wear your seat belt at  all times when driving.  Make a list of emergency phone numbers, including numbers for family, friends, the hospital, and police and fire departments. General instructions  Avoid cat litter boxes and soil used by cats. These carry germs that can cause birth defects in the baby. If you have a cat, ask someone to clean the litter box for you.  Do not travel far distances unless it is absolutely necessary and only with the approval of your health care provider.  Do not use hot tubs, steam rooms, or saunas.  Do not drink alcohol.  Do not use any products that contain nicotine or tobacco, such as cigarettes and e-cigarettes. If you need help quitting, ask your health care provider.  Do not use any medicinal herbs or unprescribed drugs. These chemicals affect the formation and growth of the baby.  Do not douche or use tampons or scented sanitary pads.  Do not cross your legs for long periods of time.  To prepare for the arrival of your baby: ? Take prenatal classes to understand, practice, and ask questions about labor and delivery. ? Make a trial run to the hospital. ? Visit the hospital and tour the maternity area. ? Arrange for maternity or paternity leave through employers. ? Arrange for family and friends to take care of pets while you are in the hospital. ? Purchase a rear-facing car seat and make sure you know how to install it in your car. ? Pack your hospital bag. ? Prepare the baby's nursery. Make sure to remove all pillows and stuffed animals from the baby's crib to prevent suffocation.  Visit your dentist if you have not gone during your pregnancy. Use a soft toothbrush to brush your teeth and be gentle when you floss. Contact a health care provider if:  You are unsure if you are in labor or if your water has broken.  You become dizzy.  You have mild pelvic cramps, pelvic pressure, or nagging pain in your abdominal area.  You have lower back pain.  You have persistent  nausea, vomiting, or diarrhea.  You have an unusual or bad smelling vaginal discharge.  You have pain when you urinate. Get help right away if:  Your water breaks before 37 weeks.  You have regular contractions less than 5 minutes apart before 37 weeks.  You have a fever.  You are leaking fluid from your vagina.  You have spotting or bleeding from your vagina.  You have severe abdominal pain or cramping.  You have rapid weight loss or weight gain.    You have shortness of breath with chest pain.  You notice sudden or extreme swelling of your face, hands, ankles, feet, or legs.  Your baby makes fewer than 10 movements in 2 hours.  You have severe headaches that do not go away when you take medicine.  You have vision changes. Summary  The third trimester is from week 28 through week 40, months 7 through 9. The third trimester is a time when the unborn baby (fetus) is growing rapidly.  During the third trimester, your discomfort may increase as you and your baby continue to gain weight. You may have abdominal, leg, and back pain, sleeping problems, and an increased need to urinate.  During the third trimester your breasts will keep growing and they will continue to become tender. A yellow fluid (colostrum) may leak from your breasts. This is the first milk you are producing for your baby.  False labor is a condition in which you feel small, irregular tightenings of the muscles in the womb (contractions) that eventually go away. These are called Braxton Hicks contractions. Contractions may last for hours, days, or even weeks before true labor sets in.  Signs of labor can include: abdominal cramps; regular contractions that start at 10 minutes apart and become stronger and more frequent with time; watery or bloody mucus discharge that comes from the vagina; increased pelvic pressure and dull back pain; and leaking of amniotic fluid. This information is not intended to replace advice  given to you by your health care provider. Make sure you discuss any questions you have with your health care provider. Document Released: 12/28/2000 Document Revised: 06/11/2015 Document Reviewed: 03/06/2012 Elsevier Interactive Patient Education  2017 [employer]  

## 2017-07-05 ENCOUNTER — Inpatient Hospital Stay (HOSPITAL_COMMUNITY): Payer: Managed Care, Other (non HMO) | Admitting: Anesthesiology

## 2017-07-05 ENCOUNTER — Other Ambulatory Visit: Payer: Self-pay

## 2017-07-05 ENCOUNTER — Inpatient Hospital Stay (HOSPITAL_COMMUNITY)
Admission: RE | Admit: 2017-07-05 | Discharge: 2017-07-07 | DRG: 805 | Disposition: A | Discharge status: Home / Self Care | Payer: Managed Care, Other (non HMO) | Attending: Obstetrics and Gynecology | Admitting: Obstetrics and Gynecology

## 2017-07-05 ENCOUNTER — Encounter (HOSPITAL_COMMUNITY): Payer: Self-pay

## 2017-07-05 VITALS — BP 125/81 | HR 76 | Temp 97.8°F | Resp 16 | Ht 64.0 in | Wt 181.3 lb

## 2017-07-05 DIAGNOSIS — K831 Obstruction of bile duct: Secondary | ICD-10-CM | POA: Diagnosis present

## 2017-07-05 DIAGNOSIS — O24425 Gestational diabetes mellitus in childbirth, controlled by oral hypoglycemic drugs: Secondary | ICD-10-CM | POA: Diagnosis present

## 2017-07-05 DIAGNOSIS — Z3A37 37 weeks gestation of pregnancy: Secondary | ICD-10-CM | POA: Diagnosis not present

## 2017-07-05 DIAGNOSIS — O2662 Liver and biliary tract disorders in childbirth: Principal | ICD-10-CM | POA: Diagnosis present

## 2017-07-05 LAB — TYPE AND SCREEN
ABO/RH(D): O POS
ANTIBODY SCREEN: NEGATIVE

## 2017-07-05 LAB — GLUCOSE, CAPILLARY
GLUCOSE-CAPILLARY: 56 mg/dL — AB (ref 65–99)
GLUCOSE-CAPILLARY: 100 mg/dL — AB (ref 65–99)
Glucose-Capillary: 116 mg/dL — ABNORMAL HIGH (ref 65–99)
Glucose-Capillary: 75 mg/dL (ref 65–99)
Glucose-Capillary: 87 mg/dL (ref 65–99)

## 2017-07-05 LAB — CBC
HEMATOCRIT: 33.4 % — AB (ref 36.0–46.0)
Hemoglobin: 11.1 g/dL — ABNORMAL LOW (ref 12.0–15.0)
MCH: 29.7 pg (ref 26.0–34.0)
MCHC: 33.2 g/dL (ref 30.0–36.0)
MCV: 89.3 fL (ref 78.0–100.0)
PLATELETS: 246 10*3/uL (ref 150–400)
RBC: 3.74 MIL/uL — ABNORMAL LOW (ref 3.87–5.11)
RDW: 14.4 % (ref 11.5–15.5)
WBC: 9.5 10*3/uL (ref 4.0–10.5)

## 2017-07-05 LAB — RPR: RPR Ser Ql: NONREACTIVE

## 2017-07-05 MED ORDER — MISOPROSTOL 200 MCG PO TABS
ORAL_TABLET | ORAL | Status: AC
  Administered 2017-07-05: 800 ug via RECTAL
  Filled 2017-07-05: qty 4

## 2017-07-05 MED ORDER — METHYLERGONOVINE MALEATE 0.2 MG/ML IJ SOLN
INTRAMUSCULAR | Status: AC
  Filled 2017-07-05: qty 1

## 2017-07-05 MED ORDER — DIPHENHYDRAMINE HCL 25 MG PO CAPS
25.0000 mg | ORAL_CAPSULE | Freq: Four times a day (QID) | ORAL | Status: DC | PRN
  Administered 2017-07-05 (×2): 25 mg via ORAL
  Filled 2017-07-05 (×2): qty 1

## 2017-07-05 MED ORDER — PHENYLEPHRINE 40 MCG/ML (10ML) SYRINGE FOR IV PUSH (FOR BLOOD PRESSURE SUPPORT)
80.0000 ug | PREFILLED_SYRINGE | INTRAVENOUS | Status: DC | PRN
  Filled 2017-07-05: qty 10
  Filled 2017-07-05: qty 5

## 2017-07-05 MED ORDER — DIPHENHYDRAMINE HCL 50 MG/ML IJ SOLN: 12.5000 mg | INTRAMUSCULAR | Status: DC | PRN

## 2017-07-05 MED ORDER — PHENYLEPHRINE 40 MCG/ML (10ML) SYRINGE FOR IV PUSH (FOR BLOOD PRESSURE SUPPORT)
80.0000 ug | PREFILLED_SYRINGE | INTRAVENOUS | Status: DC | PRN
  Filled 2017-07-05: qty 5

## 2017-07-05 MED ORDER — ACETAMINOPHEN 325 MG PO TABS
650.0000 mg | ORAL_TABLET | ORAL | Status: DC | PRN
  Administered 2017-07-05: 650 mg via ORAL
  Filled 2017-07-05: qty 2

## 2017-07-05 MED ORDER — OXYTOCIN 40 UNITS IN LACTATED RINGERS INFUSION - SIMPLE MED: 2.5000 [IU]/h | INTRAVENOUS | Status: DC

## 2017-07-05 MED ORDER — FENTANYL 2.5 MCG/ML BUPIVACAINE 1/10 % EPIDURAL INFUSION (WH - ANES)
14.0000 mL/h | INTRAMUSCULAR | Status: DC | PRN
  Administered 2017-07-05: 14 mL/h via EPIDURAL
  Filled 2017-07-05: qty 100

## 2017-07-05 MED ORDER — LACTATED RINGERS IV SOLN: 500.0000 mL | Freq: Once | INTRAVENOUS | Status: DC

## 2017-07-05 MED ORDER — TERBUTALINE SULFATE 1 MG/ML IJ SOLN
0.2500 mg | Freq: Once | INTRAMUSCULAR | Status: DC | PRN
  Filled 2017-07-05: qty 1

## 2017-07-05 MED ORDER — LACTATED RINGERS IV SOLN
INTRAVENOUS | Status: DC
  Administered 2017-07-05 (×3): via INTRAVENOUS

## 2017-07-05 MED ORDER — MISOPROSTOL 50MCG HALF TABLET
50.0000 ug | ORAL_TABLET | ORAL | Status: DC
  Administered 2017-07-05: 50 ug via BUCCAL
  Filled 2017-07-05 (×6): qty 1

## 2017-07-05 MED ORDER — OXYTOCIN 40 UNITS IN LACTATED RINGERS INFUSION - SIMPLE MED
1.0000 m[IU]/min | INTRAVENOUS | Status: DC
  Administered 2017-07-05 (×2): 2 m[IU]/min via INTRAVENOUS
  Filled 2017-07-05: qty 1000

## 2017-07-05 MED ORDER — LACTATED RINGERS IV SOLN: 500.0000 mL | INTRAVENOUS | Status: DC | PRN

## 2017-07-05 MED ORDER — MISOPROSTOL 200 MCG PO TABS
800.0000 ug | ORAL_TABLET | Freq: Once | ORAL | Status: AC
  Administered 2017-07-05: 800 ug via RECTAL

## 2017-07-05 MED ORDER — SOD CITRATE-CITRIC ACID 500-334 MG/5ML PO SOLN: 30.0000 mL | ORAL | Status: DC | PRN

## 2017-07-05 MED ORDER — LIDOCAINE HCL (PF) 1 % IJ SOLN
INTRAMUSCULAR | Status: DC | PRN
  Administered 2017-07-05: 6 mL via EPIDURAL
  Administered 2017-07-05: 4 mL via EPIDURAL

## 2017-07-05 MED ORDER — EPHEDRINE 5 MG/ML INJ
10.0000 mg | INTRAVENOUS | Status: DC | PRN
  Filled 2017-07-05: qty 2

## 2017-07-05 MED ORDER — LIDOCAINE HCL (PF) 1 % IJ SOLN
30.0000 mL | INTRAMUSCULAR | Status: DC | PRN
  Filled 2017-07-05: qty 30

## 2017-07-05 MED ORDER — ONDANSETRON HCL 4 MG/2ML IJ SOLN: 4.0000 mg | Freq: Four times a day (QID) | INTRAMUSCULAR | Status: DC | PRN

## 2017-07-05 MED ORDER — METHYLERGONOVINE MALEATE 0.2 MG/ML IJ SOLN
0.2000 mg | Freq: Once | INTRAMUSCULAR | Status: AC
Start: 2017-07-05 — End: 2017-07-05
  Administered 2017-07-05: 0.2 mg via INTRAMUSCULAR

## 2017-07-05 MED ORDER — IBUPROFEN 600 MG PO TABS
600.0000 mg | ORAL_TABLET | Freq: Four times a day (QID) | ORAL | Status: DC
  Administered 2017-07-06 – 2017-07-07 (×6): 600 mg via ORAL
  Filled 2017-07-05 (×7): qty 1

## 2017-07-05 MED ORDER — OXYTOCIN BOLUS FROM INFUSION
500.0000 mL | Freq: Once | INTRAVENOUS | Status: AC
  Administered 2017-07-05: 500 mL via INTRAVENOUS

## 2017-07-05 NOTE — Anesthesia Pain Management Evaluation Note (Signed)
  CRNA Pain Management Visit Note  Patient: Paige Welch, 27 y.o., female  "Hello I am a [facility] of the anesthesia team at [facility]. We have an anesthesia team available at all times to provide care throughout the hospital, including epidural management and anesthesia for C-section. I don't know your plan for the delivery whether it a natural birth, water birth, IV sedation, nitrous supplementation, doula or epidural, but we want to meet your pain goals."   1.Was your pain managed to your expectations on prior hospitalizations?   Yes   2.What is your expectation for pain management during this hospitalization?     Epidural  3.How can we help you reach that goal? Epidural when ready  Record the patient's initial score and the patient's pain goal.   Pain: 0  Pain Goal: 5 [facility] wants you to be able to say your pain was always managed very well.  Edison PaceWILKERSON,Autumnrose Yore 07/05/2017

## 2017-07-05 NOTE — H&P (Signed)
27 y.o. [redacted]w[redacted]d  G3P1011 comes in for schedule IOL for cholestasis of pregnancy.  Otherwise has good fetal movement and no bleeding.  Past Medical History:  Diagnosis Date  . Cholestasis during pregnancy   . Gestational diabetes   . Idiopathic thrombocytopenic purpura (ITP) (HCC)   . No pertinent past medical history     Past Surgical History:  Procedure Laterality Date  . DILATION AND CURETTAGE OF UTERUS    . NOSE SURGERY    . WISDOM TOOTH EXTRACTION      OB History  Gravida Para Term Preterm AB Living  3 1 1  0 1 1  SAB TAB Ectopic Multiple Live Births  1 0 0 0 1    # Outcome Date GA Lbr Len/2nd Weight Sex Delivery Anes PTL Lv  3 Current           2 Term 10/09/11 [redacted]w[redacted]d 06:42 / 00:53 3.065 kg (6 lb 12.1 oz) F Vag-Spont EPI  LIV  1 SAB 2010            Social History   Socioeconomic History  . Marital status: Married    Spouse name: Not on file  . Number of children: Not on file  . Years of education: Not on file  . Highest education level: Not on file  Occupational History  . Not on file  Social Needs  . Financial resource strain: Not on file  . Food insecurity:    Worry: Not on file    Inability: Not on file  . Transportation needs:    Medical: Not on file    Non-medical: Not on file  Tobacco Use  . Smoking status: Never Smoker  . Smokeless tobacco: Never Used  Substance and Sexual Activity  . Alcohol use: No  . Drug use: No  . Sexual activity: Yes  Lifestyle  . Physical activity:    Days per week: Not on file    Minutes per session: Not on file  . Stress: Not on file  Relationships  . Social connections:    Talks on phone: Not on file    Gets together: Not on file    Attends religious service: Not on file    Active member of club or organization: Not on file    Attends meetings of clubs or organizations: Not on file    Relationship status: Not on file  . Intimate partner violence:    Fear of current or ex partner: Not on file    Emotionally abused: Not  on file    Physically abused: Not on file    Forced sexual activity: Not on file  Other Topics Concern  . Not on file  Social History Narrative  . Not on file   Penicillins    Prenatal Transfer Tool  Maternal Diabetes: Yes:  Diabetes Type:  Insulin/Medication controlled Genetic Screening: Declined Maternal Ultrasounds/Referrals: Normal Fetal Ultrasounds or other Referrals:  None Maternal Substance Abuse:  No Significant Maternal Medications:  Meds include: Other: glyburide 2.5mg  bid Significant Maternal Lab Results: Lab values include: Group B Strep negative  Other PNC: GDMA2, last [place] 06/20/2017: EFW 29.1% 5#2, bile acids checked 6/14: 10.2    Vitals:   07/05/17 0754 07/05/17 0841 07/05/17 0900  BP: 107/82 111/65 118/63  Pulse: 100 100 96  Resp: 18 18 18   Temp: 97.7 F (36.5 C)    TempSrc: Oral    Weight: 82.2 kg (181 lb 4.8 oz)    Height: 5\' 4"  (1.626 m)  Lungs/Cor:  NAD Abdomen:  soft, gravid Ex:  no cords, erythema SVE:  1.5/50/-2 FHTs:  150, good STV, NST R Toco:  irregualr   A/P   Admitted for IOL for cholestasis of pregnancy  GBS Neg  GDMA2- on glyburide 2.5mg  bid.  Last BG 116@ 08:50, if remains elevated will start sliding scale.  Pitocin 2x2 ordered by scheduling physician, pt on 2. Other routine care.   Philip AspenALLAHAN, Jerrika Ledlow

## 2017-07-05 NOTE — Anesthesia Procedure Notes (Signed)
Epidural Patient location during procedure: OB Start time: 07/05/2017 8:43 PM End time: 07/05/2017 8:47 PM  Staffing Anesthesiologist: Beryle LatheBrock, Thomas E, MD Performed: anesthesiologist   Preanesthetic Checklist Completed: patient identified, pre-op evaluation, timeout performed, IV checked, risks and benefits discussed and monitors and equipment checked  Epidural Patient position: sitting Prep: DuraPrep Patient monitoring: continuous pulse ox and blood pressure Approach: midline Location: L2-L3 Injection technique: LOR saline  Needle:  Needle type: Tuohy  Needle gauge: 17 G Needle length: 9 cm Needle insertion depth: 5 cm Catheter size: 19 Gauge Catheter at skin depth: 10 cm Test dose: negative and Other (1% lidocaine)  Additional Notes Patient identified. Risks including, but not limited to, bleeding, infection, nerve damage, paralysis, inadequate analgesia, blood pressure changes, nausea, vomiting, allergic reaction, postpartum back pain, itching, and headache were discussed. Patient expressed understanding and wished to proceed. Sterile prep and drape, including hand hygiene, mask, and sterile gloves were used. The patient was positioned and the spine was prepped. The skin was anesthetized with lidocaine. No paraesthesia or other complication noted. The patient did not experience any signs of intravascular injection such as tinnitus or metallic taste in mouth, nor signs of intrathecal spread such as rapid motor block. Please see nursing notes for vital signs. The patient tolerated the procedure well.   Leslye Peerhomas Brock, MDReason for block:procedure for pain

## 2017-07-05 NOTE — Progress Notes (Signed)
Hypoglycemic Event  CBG: 56  Treatment: 15 GM carbohydrate snack (sprite, jello)   Symptoms: None  Follow-up CBG: Time:1300 CBG Result:75  Possible Reasons for Event: Inadequate meal intake  Comments/MD notified:Dr. Claiborne Billingsallahan aware, will continue to monitor.    Jennette KettleGray,Curby Carswell M

## 2017-07-05 NOTE — Anesthesia Preprocedure Evaluation (Addendum)
Anesthesia Evaluation  Patient identified by MRN, date of birth, ID band Patient awake    Reviewed: Allergy & Precautions, NPO status , Patient's Chart, lab work & pertinent test results  Airway Mallampati: I  TM Distance: >3 FB Neck ROM: Full    Dental   Pulmonary neg pulmonary ROS,    breath sounds clear to auscultation       Cardiovascular negative cardio ROS   Rhythm:Regular Rate:Normal     Neuro/Psych negative neurological ROS  negative psych ROS   GI/Hepatic Neg liver ROS, Cholestasis   Endo/Other  diabetes, Gestational, Oral Hypoglycemic Agents  Renal/GU negative Renal ROS  negative genitourinary   Musculoskeletal negative musculoskeletal ROS (+)   Abdominal   Peds  Hematology  (+) anemia , ITP - Denies hx, denies hx of bleeding/bruising   Anesthesia Other Findings   Reproductive/Obstetrics (+) Pregnancy                            Anesthesia Physical Anesthesia Plan  ASA: II  Anesthesia Plan: Epidural   Post-op Pain Management:    Induction:   PONV Risk Score and Plan:   Airway Management Planned: Natural Airway  Additional Equipment: None  Intra-op Plan:   Post-operative Plan:   Informed Consent: I have reviewed the patients History and Physical, chart, labs and discussed the procedure including the risks, benefits and alternatives for the proposed anesthesia with the patient or authorized representative who has indicated his/her understanding and acceptance.     Plan Discussed with: Anesthesiologist  Anesthesia Plan Comments: (Labs reviewed. Platelets acceptable, patient not taking any blood thinning medications. Risks and benefits discussed with patient, patient expressed understanding and wished to proceed.)        Anesthesia Quick Evaluation

## 2017-07-06 ENCOUNTER — Encounter (HOSPITAL_COMMUNITY): Payer: Self-pay

## 2017-07-06 LAB — CBC
HEMATOCRIT: 31.1 % — AB (ref 36.0–46.0)
HEMOGLOBIN: 10.2 g/dL — AB (ref 12.0–15.0)
MCH: 29.1 pg (ref 26.0–34.0)
MCHC: 32.8 g/dL (ref 30.0–36.0)
MCV: 88.6 fL (ref 78.0–100.0)
Platelets: 237 10*3/uL (ref 150–400)
RBC: 3.51 MIL/uL — AB (ref 3.87–5.11)
RDW: 14.4 % (ref 11.5–15.5)
WBC: 12.2 10*3/uL — ABNORMAL HIGH (ref 4.0–10.5)

## 2017-07-06 MED ORDER — PRENATAL MULTIVITAMIN CH
1.0000 | ORAL_TABLET | Freq: Every day | ORAL | Status: DC
  Administered 2017-07-06 – 2017-07-07 (×2): 1 via ORAL
  Filled 2017-07-06 (×2): qty 1

## 2017-07-06 MED ORDER — OXYCODONE-ACETAMINOPHEN 5-325 MG PO TABS: 1.0000 | ORAL_TABLET | ORAL | Status: DC | PRN

## 2017-07-06 MED ORDER — ONDANSETRON HCL 4 MG/2ML IJ SOLN: 4.0000 mg | INTRAMUSCULAR | Status: DC | PRN

## 2017-07-06 MED ORDER — ONDANSETRON HCL 4 MG PO TABS: 4.0000 mg | ORAL_TABLET | ORAL | Status: DC | PRN

## 2017-07-06 MED ORDER — SENNOSIDES-DOCUSATE SODIUM 8.6-50 MG PO TABS
2.0000 | ORAL_TABLET | ORAL | Status: DC
  Filled 2017-07-06 (×2): qty 2

## 2017-07-06 MED ORDER — SIMETHICONE 80 MG PO CHEW
80.0000 mg | CHEWABLE_TABLET | ORAL | Status: DC | PRN
Start: 2017-07-06 — End: 2017-07-07

## 2017-07-06 MED ORDER — DIBUCAINE 1 % RE OINT: 1.0000 "application " | TOPICAL_OINTMENT | RECTAL | Status: DC | PRN

## 2017-07-06 MED ORDER — COCONUT OIL OIL
1.0000 "application " | TOPICAL_OIL | Status: DC | PRN
  Filled 2017-07-06: qty 120

## 2017-07-06 MED ORDER — WITCH HAZEL-GLYCERIN EX PADS: 1.0000 "application " | MEDICATED_PAD | CUTANEOUS | Status: DC | PRN

## 2017-07-06 MED ORDER — TETANUS-DIPHTH-ACELL PERTUSSIS 5-2.5-18.5 LF-MCG/0.5 IM SUSP: 0.5000 mL | Freq: Once | INTRAMUSCULAR | Status: DC

## 2017-07-06 MED ORDER — ACETAMINOPHEN 325 MG PO TABS
650.0000 mg | ORAL_TABLET | ORAL | Status: DC | PRN
  Administered 2017-07-06: 650 mg via ORAL
  Filled 2017-07-06: qty 2

## 2017-07-06 MED ORDER — OXYCODONE-ACETAMINOPHEN 5-325 MG PO TABS: 2.0000 | ORAL_TABLET | ORAL | Status: DC | PRN

## 2017-07-06 MED ORDER — BENZOCAINE-MENTHOL 20-0.5 % EX AERO
1.0000 "application " | INHALATION_SPRAY | CUTANEOUS | Status: DC | PRN
  Administered 2017-07-06: 1 via TOPICAL
  Filled 2017-07-06: qty 56

## 2017-07-06 MED ORDER — DIPHENHYDRAMINE HCL 25 MG PO CAPS: 25.0000 mg | ORAL_CAPSULE | Freq: Four times a day (QID) | ORAL | Status: DC | PRN

## 2017-07-06 MED ORDER — ZOLPIDEM TARTRATE 5 MG PO TABS: 5.0000 mg | ORAL_TABLET | Freq: Every evening | ORAL | Status: DC | PRN

## 2017-07-06 NOTE — Lactation Note (Signed)
This note was copied from a baby's chart. Lactation Consultation Note Baby 9 hrs old. Mom has tried to feed the baby several times but has no interest.  LC hand expressed  Drops of colostrum and rubbed on gums w/gloved finger. Baby has tight grip, clamping down. Took several attempts to get baby to suckle on gloved finger.  Placed at breast. Baby had no interest, wouldn't open. Placed baby laying on mom's chest STS.  Mom has large tubular breast. Short shaft areola. Some areola edema to Lt. Breast. Everts more w/stimulateion. Reverse pressure around Lt. Nipple helpful. Shells given to wear between feedings. Hand pump given to pre-pump prior to latching. Mom has 55 yr old daughter that she BF for 7 months. Mom stated her nipples were inverted then and had to wear a NS.   Reviewed newborn behavior, STS, I&O, cluster feeding. Mom encouraged to feed baby 8-12 times/24 hours and with feeding cues.  Encouraged mom if baby hasn't cued by 3 hrs. Stimulate to feed. Hand express and spoon feed. Baby has facial bruising around nose and mouth. Encouraged to call for assistance if needed,. WH/LC brochure given w/resources, support groups and LC services.   Patient Name: Paige Welch AVWUJ'WToday's Date: 07/06/2017 Reason for consult: Initial assessment   Maternal Data    Feeding Feeding Type: Breast Fed Length of feed: 10 min  LATCH Score                   Interventions    Lactation Tools Discussed/Used     Consult Status      Merlinda Wrubel G 07/06/2017, 6:13 AM

## 2017-07-06 NOTE — Progress Notes (Signed)
PPD#1 Pt without complaints VSSAF IMP/ Stable Plan/ Routine care 

## 2017-07-06 NOTE — Anesthesia Postprocedure Evaluation (Signed)
Anesthesia Post Note  Patient: [name]  Procedure(s) Performed: AN AD HOC LABOR EPIDURAL     Patient location during evaluation: Mother Baby Anesthesia Type: Epidural Level of consciousness: awake, awake and alert and oriented Vital Signs Assessment: post-procedure vital signs reviewed and stable Respiratory status: spontaneous breathing, nonlabored ventilation and respiratory function stable Cardiovascular status: stable Postop Assessment: no headache, no backache, patient able to bend at knees, no apparent nausea or vomiting and able to ambulate Anesthetic complications: no    Last Vitals:  Vitals:   07/06/17 0202 07/06/17 0537  BP: (!) 123/54 125/76  Pulse: 92 84  Resp: 18 16  Temp: 36.9 C 37 C  SpO2: 99% 100%    Last Pain:  Vitals:   07/06/17 0539  TempSrc:   PainSc: 0-No pain   Pain Goal:                 [name]

## 2017-07-07 LAB — BIRTH TISSUE RECOVERY COLLECTION (PLACENTA DONATION)

## 2017-07-07 MED ORDER — IBUPROFEN 600 MG PO TABS: 600.0000 mg | ORAL_TABLET | Freq: Four times a day (QID) | ORAL | 0 refills | Status: DC | PRN

## 2017-07-07 MED ORDER — OXYCODONE-ACETAMINOPHEN 5-325 MG PO TABS: 1.0000 | ORAL_TABLET | Freq: Four times a day (QID) | ORAL | 0 refills | Status: DC | PRN

## 2017-07-07 NOTE — Lactation Note (Signed)
This note was copied from a baby's chart. Lactation Consultation Note Mom resting quietly awake. Flat affect, tired. Waiting on lab to draw bili serum on baby. Denies need for assistance or anything thing. Mom states baby is feeding much better.  Patient Name: Paige Welch [id number]'YToday's Date: 07/07/2017 Reason for consult: Follow-up assessment   Maternal Data    Feeding Feeding Type: Breast Fed Length of feed: 15 min  LATCH Score                   Interventions    Lactation Tools Discussed/Used     Consult Status Consult Status: Follow-up Date: 07/07/17 Follow-up type: In-patient    Taren Toops, Diamond NickelLAURA G 07/07/2017, 5:05 AM

## 2017-07-07 NOTE — Progress Notes (Signed)
RN in to assess patient  At this time. Father of baby currently in room with patient. Father of baby stated they would like to get some rest tonight "we can call you if we need you". Went over plan of care with mother. Will continue to assist patient.

## 2017-07-07 NOTE — Discharge Summary (Signed)
Obstetric Discharge Summary Reason for Admission: induction of labor Prenatal Procedures: NST and ultrasound Intrapartum Procedures: spontaneous vaginal delivery Postpartum Procedures: none Complications-Operative and Postpartum: none Hemoglobin  Date Value Ref Range Status  07/06/2017 10.2 (L) 12.0 - 15.0 g/dL Final   HCT  Date Value Ref Range Status  07/06/2017 31.1 (L) 36.0 - 46.0 % Final    Physical Exam:  General: alert, cooperative and appears stated age 21Lochia: appropriate Uterine Fundus: firm    Discharge Diagnoses: Term Pregnancy-delivered  Discharge Information: Date: 07/07/2017 Activity: pelvic rest Diet: routine Medications: Ibuprofen and Percocet Condition: improved Instructions: refer to practice specific booklet Discharge to: home Follow-up Information    Philip AspenCallahan, [place], DO Follow up.   Specialty:  Obstetrics and Gynecology Contact information: [address] [place] [address] [phone]           Newborn Data: Live born female  Birth Weight: 7 lb 3.2 oz (3266 g) APGAR: 8, 9  Newborn Delivery   Birth date/time:  07/05/2017 22:30:00 Delivery type:  Vaginal, Spontaneous     Home with mother.  Waynard ReedsKendra Oluwaferanmi Wain 07/07/2017, 11:12 AM

## 2017-07-08 ENCOUNTER — Ambulatory Visit: Payer: Self-pay

## 2017-07-08 NOTE — Lactation Note (Signed)
This note was copied from a baby's chart. Lactation Consultation Note  Patient Name: Paige Welch ZOXWR'UToday's Date: 07/08/2017 Reason for consult: Follow-up assessment;Early term 37-38.6wks;Hyperbilirubinemia;Infant weight loss(6% weight loss )  Baby is 7259 hours old  LC reviewed doc flow sheets  Per mom baby recently breast fed @ 8:30 for 30 mins , breast are fuller and hearing more swallows, and breast warmer. Per mom breast feeding is going well, and I prefer to supplement afterwards with bottle since the SNS tube doesn't stay in the mouth well.  LC reviewed the proper way to use the 62F SNS, and recommended 1st latching the baby and have dad insert the  62F SNS in the side of the mouth about 1- 1/2 inches, and as the baby sucks will bring the milk down out of the syringe. LC recommended if using the SNS, can fed and supplement with the feeding, and then post pump.  Also if its 3 hours and the baby hasn't waken up , to wake the baby up, and if won't latch feed appetizer and then latch.  Mom denies soreness, has shells, and DEBP Medela at home.  Sore nipple and engorgement prevention and tx reviewed.  LC dicussed nutritive vs non - nutritive sucking patterns and the importance of feeding STS every feeding until the  Baby can stay awake for a feeding, and the is gaining back to birth weight.  Mother informed of post-discharge support and given phone number to the lactation department, including services for phone call assistance; out-patient appointments; and breastfeeding support group. List of other breastfeeding resources in the community given in the handout. Encouraged mother to call for problems or concerns related to breastfeeding.    Maternal Data Has patient been taught Hand Expression?: Yes  Feeding Feeding Type: (baby recently BF per mom ) Length of feed: 30 min  LATCH Score                   Interventions Interventions: Breast feeding basics  reviewed;DEBP;Shells  Lactation Tools Discussed/Used WIC Program: No   Consult Status Consult Status: Complete Date: 07/08/17    Kathrin GreathouseMargaret Ann Lowery Paullin 07/08/2017, 9:55 AM

## 2019-01-18 NOTE — L&D Delivery Note (Signed)
Delivery Note Patient was admitted for elective induction of labor.  She was started on pitocin and underwent amniotomy.   Upon becoming painful with contractions, she progressed rapidly from 5 to 10 cm.  She pushed well for 15 minutes.  At 1:49 AM a viable female was delivered via Vaginal, Spontaneous (Presentation:  ROA ).  APGAR: 4, 9 ; weight 3986 gm (8lb 12.6oz) .    A nuchal cord x 1 was reduced.  The head was slow to deliver.  A shoulder dystocia was identified and more help was requested.  The patient was instructed to stop pushing.  The bed was reclined.  Suprapubic pressure and McRoberts did not release the shoulder.  The right posterior arm was delivered.  With delivery of the arm, the shoulders and body rotated and additional suprapubic pressure then easily released the anterior shoulder.  The cord was quickly clamped and cut and the baby taken to the warmer for evaluation by the NICU team.  She did not require any PPV and was moving both arms spontaneously.   She was placed skin to skin with mom after delivery of the placenta.  Placenta status: Spontaneous, Intact.  Cord: 3 vessels with the following complications: None .  Cord pH: n/a  Anesthesia: Epidural Episiotomy: None Lacerations: None Suture Repair: n/a Est. Blood Loss (mL): 175  Mom to postpartum.  Baby to Couplet care / Skin to Skin.  Germaine Ripp GEFFEL Ahren Pettinger 01/15/2020, 2:00 AM

## 2019-06-13 ENCOUNTER — Other Ambulatory Visit: Payer: Self-pay

## 2019-06-13 ENCOUNTER — Encounter (HOSPITAL_COMMUNITY): Payer: Self-pay | Admitting: Obstetrics & Gynecology

## 2019-06-13 ENCOUNTER — Inpatient Hospital Stay (HOSPITAL_COMMUNITY): Payer: Commercial Managed Care - PPO

## 2019-06-13 ENCOUNTER — Inpatient Hospital Stay (HOSPITAL_COMMUNITY)
Admission: AD | Admit: 2019-06-13 | Discharge: 2019-06-13 | Disposition: A | Discharge status: Home / Self Care | Payer: Commercial Managed Care - PPO | Attending: Obstetrics & Gynecology | Admitting: Obstetrics & Gynecology

## 2019-06-13 DIAGNOSIS — O219 Vomiting of pregnancy, unspecified: Secondary | ICD-10-CM | POA: Diagnosis not present

## 2019-06-13 DIAGNOSIS — R42 Dizziness and giddiness: Secondary | ICD-10-CM | POA: Diagnosis not present

## 2019-06-13 DIAGNOSIS — R110 Nausea: Secondary | ICD-10-CM | POA: Insufficient documentation

## 2019-06-13 DIAGNOSIS — O26891 Other specified pregnancy related conditions, first trimester: Secondary | ICD-10-CM | POA: Insufficient documentation

## 2019-06-13 DIAGNOSIS — Z880 Allergy status to penicillin: Secondary | ICD-10-CM | POA: Diagnosis not present

## 2019-06-13 DIAGNOSIS — Z79899 Other long term (current) drug therapy: Secondary | ICD-10-CM | POA: Diagnosis not present

## 2019-06-13 DIAGNOSIS — Z3A08 8 weeks gestation of pregnancy: Secondary | ICD-10-CM | POA: Diagnosis not present

## 2019-06-13 DIAGNOSIS — O3680X0 Pregnancy with inconclusive fetal viability, not applicable or unspecified: Secondary | ICD-10-CM

## 2019-06-13 LAB — CBC
HCT: 41.1 % (ref 36.0–46.0)
Hemoglobin: 13.9 g/dL (ref 12.0–15.0)
MCH: 31.1 pg (ref 26.0–34.0)
MCHC: 33.8 g/dL (ref 30.0–36.0)
MCV: 91.9 fL (ref 80.0–100.0)
Platelets: 257 10*3/uL (ref 150–400)
RBC: 4.47 MIL/uL (ref 3.87–5.11)
RDW: 13.4 % (ref 11.5–15.5)
WBC: 15.9 10*3/uL — ABNORMAL HIGH (ref 4.0–10.5)
nRBC: 0 % (ref 0.0–0.2)

## 2019-06-13 LAB — POC URINE PREG, ED: Preg Test, Ur: POSITIVE — AB

## 2019-06-13 MED ORDER — DOXYLAMINE-PYRIDOXINE 10-10 MG PO TBEC: 2.0000 | DELAYED_RELEASE_TABLET | Freq: Every day | ORAL | 1 refills | Status: DC

## 2019-06-13 MED ORDER — METOCLOPRAMIDE HCL 10 MG PO TABS
10.0000 mg | ORAL_TABLET | Freq: Four times a day (QID) | ORAL | 0 refills | Status: DC
Start: 2019-06-13 — End: 2019-12-09

## 2019-06-13 MED ORDER — METOCLOPRAMIDE HCL 10 MG PO TABS
10.0000 mg | ORAL_TABLET | Freq: Once | ORAL | Status: AC
  Administered 2019-06-13: 10 mg via ORAL
  Filled 2019-06-13: qty 1

## 2019-06-13 NOTE — ED Notes (Signed)
Report given to MAU charge RN. Transport has been called to transport patient over.

## 2019-06-13 NOTE — MAU Note (Signed)
Nauseated all day and when sits up feels light-headed. Has not vomited. Drank 2-3 sips of sprite, didn't want to eat.  Home UPT positive 2 weeks ago.  June 10th is appt at [facility].  No bleeding.

## 2019-06-13 NOTE — MAU Provider Note (Signed)
History     CSN: [id number]  Arrival date and time: 06/13/19 2030   First Provider Initiated Contact with Patient 06/13/19 2221      Chief Complaint  Patient presents with  . Dizziness  . Pregnant   Paige Welch is a 29 y.o. [id number] at [redacted]w[redacted]d by Definite LMP of April 20, 2019 who receives care at [facility].  She presents today for Dizziness and Nausea.  She states she has never had nausea in pregnancy and did not know what to take.  She states the nausea started this morning as well as some dizziness. She states her symptoms are improved with laying down and worsened with sitting up.  She reports she has not had anything to eat today. She reports having some gatorade ("small one") earlier today, but did not drink it all.  She also reports taking a few sips of Sprite. Patient reports that she had nausea in her previous pregnancy, but it ended in miscarriage.  Patient reports concern that she could possibly be having another miscarriage.      OB History    Gravida  4   Para  2   Term  2   Preterm  0   AB  1   Living  2     SAB  1   TAB  0   Ectopic  0   Multiple  0   Live Births  2           Past Medical History:  Diagnosis Date  . Cholestasis during pregnancy   . Gestational diabetes   . Idiopathic thrombocytopenic purpura (ITP) (HCC)   . No pertinent past medical history     Past Surgical History:  Procedure Laterality Date  . DILATION AND CURETTAGE OF UTERUS    . NOSE SURGERY    . WISDOM TOOTH EXTRACTION      Family History  Problem Relation Age of Onset  . Diabetes Maternal Aunt   . Heart disease Maternal Uncle   . Hypertension Maternal Grandmother   . Stroke Maternal Grandfather   . Anesthesia problems Neg Hx     Social History   Tobacco Use  . Smoking status: Never Smoker  . Smokeless tobacco: Never Used  Substance Use Topics  . Alcohol use: No  . Drug use: No    Allergies:  Allergies  Allergen Reactions  . Penicillins  Hives    Has patient had a PCN reaction causing immediate rash, facial/tongue/throat swelling, SOB or lightheadedness with hypotension:  yes Has patient had a PCN reaction causing severe rash involving mucus membranes or skin necrosis: no Has patient had a PCN reaction that required hospitalization: no Has patient had a PCN reaction occurring within the last 10 years: no If all of the above answers are "NO", then may proceed with Cephalosporin use.     Medications Prior to Admission  Medication Sig Dispense Refill Last Dose  . loratadine (CLARITIN) 10 MG tablet Take 10 mg by mouth daily.   06/12/2019 at Unknown time  . Prenatal Vit-Fe Fumarate-FA (MULTIVITAMIN-PRENATAL) 27-0.8 MG TABS tablet Take 1 tablet by mouth daily at 12 noon.   06/12/2019 at Unknown time  . ibuprofen (ADVIL,MOTRIN) 600 MG tablet Take 1 tablet (600 mg total) by mouth every 6 (six) hours as needed. 90 tablet 0   . oxyCODONE-acetaminophen (PERCOCET/ROXICET) 5-325 MG tablet Take 1-2 tablets by mouth every 6 (six) hours as needed for severe pain. 6 tablet 0  Review of Systems  Constitutional: Negative for chills and fever.  Respiratory: Positive for cough (Allergy related). Negative for shortness of breath.   Gastrointestinal: Positive for nausea. Negative for abdominal pain and vomiting.  Genitourinary: Negative for difficulty urinating, dysuria, pelvic pain, vaginal bleeding and vaginal discharge.  Musculoskeletal: Negative for back pain.  Neurological: Positive for dizziness and light-headedness. Negative for headaches.   Physical Exam   Blood pressure 108/61, pulse (!) 103, temperature 98.9 F (37.2 C), temperature source Oral, resp. rate 16, height 5\' 4"  (1.626 m), weight 72.1 kg, last menstrual period 04/20/2019, SpO2 98 %, unknown if currently breastfeeding.  Physical Exam  Constitutional: She is oriented to person, place, and time. She appears well-developed and well-nourished.  HENT:  Head: Normocephalic  and atraumatic.  Eyes: Conjunctivae are normal.  Cardiovascular: Normal rate, regular rhythm and normal heart sounds.  Respiratory: Effort normal.  GI: Soft.  Musculoskeletal:     Cervical back: Normal range of motion.  Neurological: She is alert and oriented to person, place, and time.  Skin: Skin is warm and dry.  Psychiatric: She has a normal mood and affect. Her behavior is normal.    MAU Course  Procedures Results for orders placed or performed during the hospital encounter of 06/13/19 (from the past 24 hour(s))  POC urine preg, ED     Status: Abnormal   Collection Time: 06/13/19  9:18 PM  Result Value Ref Range   Preg Test, Ur POSITIVE (A) NEGATIVE  CBC     Status: Abnormal   Collection Time: 06/13/19 11:05 PM  Result Value Ref Range   WBC 15.9 (H) 4.0 - 10.5 K/uL   RBC 4.47 3.87 - 5.11 MIL/uL   Hemoglobin 13.9 12.0 - 15.0 g/dL   HCT 41.1 36.0 - 46.0 %   MCV 91.9 80.0 - 100.0 fL   MCH 31.1 26.0 - 34.0 pg   MCHC 33.8 30.0 - 36.0 g/dL   RDW 13.4 11.5 - 15.5 %   Platelets 257 150 - 400 K/uL   nRBC 0.0 0.0 - 0.2 %   US OB Comp Less 14 Wks  Result Date: 06/13/2019 CLINICAL DATA:  Pregnancy of unknown location EXAM: OBSTETRIC <14 WK ULTRASOUND TECHNIQUE: Transabdominal ultrasound was performed for evaluation of the gestation as well as the maternal uterus and adnexal regions. COMPARISON:  Prior gestational ultrasound 10/18/2010 FINDINGS: Intrauterine gestational sac: Single Yolk sac:  Visualized. Embryo:  Visualized. Cardiac Activity: Visualized. Heart Rate: 174 bpm CRL:   20.1 mm   8 w 4 d                  [place] EDC: 01/19/2020 Subchorionic hemorrhage:  None visualized. Maternal uterus/adnexae: Normal appearance of the maternal uterus and ovaries. Right ovary measures 2.9 x 2.5 x 2.7 cm, left ovary 3.5 x 2.7 x 4.2 cm. No free fluid. IMPRESSION: Single viable intrauterine gestation at approximately 8 weeks 4 days by crown-rump length sonographic estimation. Electronically Signed   By:  Lovena Le M.D.   On: 06/13/2019 23:07    MDM Physical Exam AntiEmetic [place] Assessment and Plan  29 year old G4P2012 at 8.4 weeks Nausea Pregnancy of Unknown Location  -POC reviewed.  -Discussed giving antiemetic for nausea. -Will give reglan 10mg  and reassess.  -Informed that we will send for ultrasound to confirm pregnancy.  Maryann Conners 06/13/2019, 10:21 PM   Reassessment (11:50 PM) SIUP at 8.4 weeks  -Patient reports that nausea has improved. -Informed that will send Reglan for prn  usage to pharmacy on file.  -Reviewed US findings and how dates would be changed to reflect GA. -New EDD 01/19/2019 -Instructed to keep appt as scheduled for June 10th. -Patient and husband without questions or concerns. -Nurse reports patient requesting information of pregnancy safe sleep aids. -Will send script to pharmacy for Doxylamine-Pyridoxine 2 tablets at [facility] for sleep and nausea. -Nurse instructed to give medication education and inform patient to use Reglan during the day for break through nausea.  -Encouraged to call or return to MAU if symptoms worsen or with the onset of new symptoms. -Discharged to home in improved condition.  Cherre Robins MSN, CNM Advanced Practice Provider, [facility] [employer]

## 2019-06-13 NOTE — ED Provider Notes (Signed)
MSE was initiated and I personally evaluated the patient and placed orders (if any) at  9:05 PM on Jun 13, 2019.  The patient appears stable so that the remainder of the MSE may be completed by another provider.  BP 125/63 (BP Location: Right Arm)   Pulse 100   Temp 99.3 F (37.4 C) (Oral)   Resp 16   SpO2 100%   G4P2 ~ 8 wks. LMP 04/20/2019. Nausea all day, sitting up causes lightheadedness, hasn't been able to eat. No abd pain., bleeding, abnl d/c. 1st preg similar feeling with miscarriage. Prev pregnancies complicated by GDM.  Abdominal exam is benign today.  Heart and lung sounds are normal.  Discussed with Shanda Bumps, MAU provider, and accepted for transfer to the MAU for further evaluation.   Ica Daye, [place] N, PA-C 06/13/19 2133    Sabas Sous, MD 06/19/19 [phone]

## 2019-06-13 NOTE — Discharge Instructions (Signed)

## 2019-06-14 LAB — HCG, QUANTITATIVE, PREGNANCY: hCG, Beta Chain, Quant, S: 186742 m[IU]/mL — ABNORMAL HIGH (ref <5)

## 2019-12-09 ENCOUNTER — Inpatient Hospital Stay (HOSPITAL_COMMUNITY)
Admission: AD | Admit: 2019-12-09 | Discharge: 2019-12-09 | Disposition: A | Discharge status: Home / Self Care | Payer: Commercial Managed Care - PPO | Attending: Obstetrics & Gynecology | Admitting: Obstetrics & Gynecology

## 2019-12-09 ENCOUNTER — Other Ambulatory Visit: Payer: Self-pay

## 2019-12-09 ENCOUNTER — Encounter (HOSPITAL_COMMUNITY): Payer: Self-pay | Admitting: Obstetrics and Gynecology

## 2019-12-09 DIAGNOSIS — Z79899 Other long term (current) drug therapy: Secondary | ICD-10-CM | POA: Diagnosis not present

## 2019-12-09 DIAGNOSIS — O99613 Diseases of the digestive system complicating pregnancy, third trimester: Secondary | ICD-10-CM | POA: Diagnosis not present

## 2019-12-09 DIAGNOSIS — Z880 Allergy status to penicillin: Secondary | ICD-10-CM | POA: Diagnosis not present

## 2019-12-09 DIAGNOSIS — O26893 Other specified pregnancy related conditions, third trimester: Secondary | ICD-10-CM | POA: Insufficient documentation

## 2019-12-09 DIAGNOSIS — Z3A34 34 weeks gestation of pregnancy: Secondary | ICD-10-CM | POA: Diagnosis not present

## 2019-12-09 DIAGNOSIS — K5909 Other constipation: Secondary | ICD-10-CM

## 2019-12-09 DIAGNOSIS — R102 Pelvic and perineal pain: Secondary | ICD-10-CM | POA: Diagnosis not present

## 2019-12-09 MED ORDER — FLEET ENEMA 7-19 GM/118ML RE ENEM
1.0000 | ENEMA | Freq: Once | RECTAL | Status: AC
  Administered 2019-12-09: 1 via RECTAL

## 2019-12-09 MED ORDER — NIFEDIPINE 10 MG PO CAPS
10.0000 mg | ORAL_CAPSULE | ORAL | Status: AC | PRN
  Administered 2019-12-09 (×3): 10 mg via ORAL
  Filled 2019-12-09 (×3): qty 1

## 2019-12-09 MED ORDER — DOCUSATE SODIUM 100 MG PO CAPS: 100.0000 mg | ORAL_CAPSULE | Freq: Three times a day (TID) | ORAL | 0 refills | Status: DC

## 2019-12-09 MED ORDER — POLYETHYLENE GLYCOL 3350 17 G PO PACK: 17.0000 g | PACK | Freq: Every day | ORAL | 0 refills | Status: DC

## 2019-12-09 NOTE — Discharge Instructions (Signed)
-  if insurance does not cover colace, you can buy docusate sodium over the counter and take 100 mg three times a day.  -if insurance does not cover miralax, you can buy over the counter miralax and take twice a day.  -add fiber wafers to your diet  Constipation, Adult Constipation is when a person has fewer bowel movements in a week than normal, has difficulty having a bowel movement, or has stools that are dry, hard, or larger than normal. Constipation may be caused by an underlying condition. It may become worse with age if a person takes certain medicines and does not take in enough fluids. Follow these instructions at home: Eating and drinking   Eat foods that have a lot of fiber, such as fresh fruits and vegetables, whole grains, and beans.  Limit foods that are high in fat, low in fiber, or overly processed, such as french fries, hamburgers, cookies, candies, and soda.  Drink enough fluid to keep your urine clear or pale yellow. General instructions  Exercise regularly or as told by your health care provider.  Go to the restroom when you have the urge to go. Do not hold it in.  Take over-the-counter and prescription medicines only as told by your health care provider. These include any fiber supplements.  Practice pelvic floor retraining exercises, such as deep breathing while relaxing the lower abdomen and pelvic floor relaxation during bowel movements.  Watch your condition for any changes.  Keep all follow-up visits as told by your health care provider. This is important. Contact a health care provider if:  You have pain that gets worse.  You have a fever.  You do not have a bowel movement after 4 days.  You vomit.  You are not hungry.  You lose weight.  You are bleeding from the anus.  You have thin, pencil-like stools. Get help right away if:  You have a fever and your symptoms suddenly get worse.  You leak stool or have blood in your stool.  Your abdomen  is bloated.  You have severe pain in your abdomen.  You feel dizzy or you faint. This information is not intended to replace advice given to you by your health care provider. Make sure you discuss any questions you have with your health care provider. Document Revised: 12/16/2016 Document Reviewed: 06/24/2015 Elsevier Patient Education  2020 [employer].

## 2019-12-09 NOTE — MAU Provider Note (Signed)
Patient Paige Welch is a 29 y.o. [phone] at [redacted]w[redacted]d here with complaints of pelvic pressure that started around 2 pm. She also endorses contractions. She denies LOF, decreased fetal movements, vaginal bleeding or other complaints.  She reports uncomplicated pregnancy thus far.  She arrived by EMS.  Last intercourse was withn the past 24 hours.  History     CSN: [id number]  Arrival date and time: 12/09/19 1714   None     Chief Complaint  Patient presents with  . Vaginal Pressure   Abdominal Pain This is a new problem. The current episode started today. The problem occurs intermittently. The problem has been unchanged. The pain is located in the periumbilical region, suprapubic region and generalized abdominal region. The pain is at a severity of 5/10. The quality of the pain is cramping. The abdominal pain does not radiate. Pertinent negatives include no constipation, diarrhea, nausea or vomiting.    OB History    Gravida  4   Para  2   Term  2   Preterm  0   AB  1   Living  2     SAB  1   TAB  0   Ectopic  0   Multiple  0   Live Births  2           Past Medical History:  Diagnosis Date  . Cholestasis during pregnancy   . Gestational diabetes   . Idiopathic thrombocytopenic purpura (ITP) (HCC)   . No pertinent past medical history     Past Surgical History:  Procedure Laterality Date  . DILATION AND CURETTAGE OF UTERUS    . NOSE SURGERY    . WISDOM TOOTH EXTRACTION      Family History  Problem Relation Age of Onset  . Diabetes Maternal Aunt   . Heart disease Maternal Uncle   . Hypertension Maternal Grandmother   . Stroke Maternal Grandfather   . Anesthesia problems Neg Hx     Social History   Tobacco Use  . Smoking status: Never Smoker  . Smokeless tobacco: Never Used  Vaping Use  . Vaping Use: Never used  Substance Use Topics  . Alcohol use: No  . Drug use: No    Allergies:  Allergies  Allergen Reactions  . Penicillins Hives     Has patient had a PCN reaction causing immediate rash, facial/tongue/throat swelling, SOB or lightheadedness with hypotension:  yes Has patient had a PCN reaction causing severe rash involving mucus membranes or skin necrosis: no Has patient had a PCN reaction that required hospitalization: no Has patient had a PCN reaction occurring within the last 10 years: no If all of the above answers are "NO", then may proceed with Cephalosporin use.     Medications Prior to Admission  Medication Sig Dispense Refill Last Dose  . Doxylamine-Pyridoxine 10-10 MG TBEC Take 2 tablets by mouth at bedtime. 60 tablet 1   . loratadine (CLARITIN) 10 MG tablet Take 10 mg by mouth daily.     . metoCLOPramide (REGLAN) 10 MG tablet Take 1 tablet (10 mg total) by mouth every 6 (six) hours. 30 tablet 0   . Prenatal Vit-Fe Fumarate-FA (MULTIVITAMIN-PRENATAL) 27-0.8 MG TABS tablet Take 1 tablet by mouth daily at 12 noon.       Review of Systems  Constitutional: Negative.   HENT: Negative.   Respiratory: Negative.   Cardiovascular: Negative.   Gastrointestinal: Positive for abdominal pain. Negative for constipation, diarrhea, nausea and vomiting.  Genitourinary:  Negative for vaginal bleeding.  Neurological: Negative.   Psychiatric/Behavioral: Negative.    Physical Exam   Blood pressure 130/72, pulse 94, resp. rate 18, height 5\' 4"  (1.626 m), weight 84.4 kg, last menstrual period 04/20/2019, SpO2 100 %, unknown if currently breastfeeding.  Physical Exam Constitutional:      Appearance: Normal appearance.  Genitourinary:    General: Normal vulva.  Musculoskeletal:        General: Normal range of motion.  Skin:    General: Skin is warm.  Neurological:     Mental Status: She is alert.   Cervix is long, closed, posterior. Notable stool burden on exam. Contractions palpate soft.   MAU Course  Procedures  MDM -NST: 140 bpm, mod var, present acel, no decels, occasional contractions -Explained to patient  that stool is palpated through the posterior vaginal wall, will offer enema. Patient declines IV, would like ice water. Patient declines testing for GC/CT wet prep.   1952: Patient has s/p 2 doses of procardia and 2 Flkeet enema. Still has not had a BM. Would like something to try at home. Has only had minimal PO water but would like to try ice.   Patient reports that pain is "less than a 4" and that she just feels the pressure in her bottom.   Recheck of cervix before discharge shows cervix is still closed, long, posterior. Stool burden still palpated.   Assessment and Plan   1. Other constipation    -patient stable for discharge with recommendations to pick up RX for colace and miralax, dietary changes including fiber reviewed.  -Keep fup appt at [facility] in 10 days.  -Labor and preterm labor precautions reviewed.  -All questions answered.   [facility] Paige Welch 12/09/2019, 5:27 PM

## 2019-12-09 NOTE — MAU Note (Signed)
Pt c/o vaginal pressure beginning approximately 3 hours ago.  Patient endorses +FM, no vaginal bleeding, discharge, or LOF.  Pt c/o nausea, no emesis.

## 2020-01-01 ENCOUNTER — Other Ambulatory Visit: Payer: Self-pay | Admitting: Obstetrics and Gynecology

## 2020-01-03 ENCOUNTER — Telehealth (HOSPITAL_COMMUNITY): Payer: Self-pay | Admitting: *Deleted

## 2020-01-03 NOTE — Telephone Encounter (Signed)
Preadmission screen  

## 2020-01-06 ENCOUNTER — Telehealth (HOSPITAL_COMMUNITY): Payer: Self-pay | Admitting: *Deleted

## 2020-01-06 NOTE — Telephone Encounter (Signed)
Preadmission screen  

## 2020-01-07 ENCOUNTER — Encounter (HOSPITAL_COMMUNITY): Payer: Self-pay | Admitting: *Deleted

## 2020-01-07 ENCOUNTER — Telehealth (HOSPITAL_COMMUNITY): Payer: Self-pay | Admitting: *Deleted

## 2020-01-07 NOTE — Telephone Encounter (Signed)
Preadmission screen  

## 2020-01-14 ENCOUNTER — Encounter (HOSPITAL_COMMUNITY): Payer: Self-pay | Admitting: Obstetrics and Gynecology

## 2020-01-14 ENCOUNTER — Other Ambulatory Visit: Payer: Self-pay

## 2020-01-14 ENCOUNTER — Inpatient Hospital Stay (HOSPITAL_COMMUNITY)
Admission: AD | Admit: 2020-01-14 | Discharge: 2020-01-16 | DRG: 807 | Disposition: A | Discharge status: Home / Self Care | Payer: Commercial Managed Care - PPO | Attending: Obstetrics | Admitting: Obstetrics

## 2020-01-14 ENCOUNTER — Inpatient Hospital Stay (HOSPITAL_COMMUNITY): Payer: Commercial Managed Care - PPO

## 2020-01-14 DIAGNOSIS — Z3A39 39 weeks gestation of pregnancy: Secondary | ICD-10-CM | POA: Diagnosis not present

## 2020-01-14 DIAGNOSIS — Z880 Allergy status to penicillin: Secondary | ICD-10-CM

## 2020-01-14 DIAGNOSIS — Z20822 Contact with and (suspected) exposure to covid-19: Secondary | ICD-10-CM | POA: Diagnosis present

## 2020-01-14 DIAGNOSIS — Z3490 Encounter for supervision of normal pregnancy, unspecified, unspecified trimester: Secondary | ICD-10-CM

## 2020-01-14 DIAGNOSIS — O26893 Other specified pregnancy related conditions, third trimester: Secondary | ICD-10-CM | POA: Diagnosis present

## 2020-01-14 HISTORY — DX: Supervision of pregnancy with other poor reproductive or obstetric history, third trimester: O09.293

## 2020-01-14 HISTORY — DX: Personal history of other diseases of the digestive system: Z87.19

## 2020-01-14 LAB — CBC
HCT: 34.8 % — ABNORMAL LOW (ref 36.0–46.0)
Hemoglobin: 11.7 g/dL — ABNORMAL LOW (ref 12.0–15.0)
MCH: 29.7 pg (ref 26.0–34.0)
MCHC: 33.6 g/dL (ref 30.0–36.0)
MCV: 88.3 fL (ref 80.0–100.0)
Platelets: 268 10*3/uL (ref 150–400)
RBC: 3.94 MIL/uL (ref 3.87–5.11)
RDW: 14.8 % (ref 11.5–15.5)
WBC: 9.8 10*3/uL (ref 4.0–10.5)
nRBC: 0 % (ref 0.0–0.2)

## 2020-01-14 LAB — TYPE AND SCREEN
ABO/RH(D): O POS
Antibody Screen: NEGATIVE

## 2020-01-14 LAB — RESP PANEL BY RT-PCR (FLU A&B, COVID) ARPGX2
Influenza A by PCR: NEGATIVE
Influenza B by PCR: NEGATIVE
SARS Coronavirus 2 by RT PCR: NEGATIVE

## 2020-01-14 MED ORDER — PHENYLEPHRINE 40 MCG/ML (10ML) SYRINGE FOR IV PUSH (FOR BLOOD PRESSURE SUPPORT): 80.0000 ug | PREFILLED_SYRINGE | INTRAVENOUS | Status: DC | PRN

## 2020-01-14 MED ORDER — DIPHENHYDRAMINE HCL 50 MG/ML IJ SOLN
12.5000 mg | INTRAMUSCULAR | Status: DC | PRN
Start: 2020-01-14 — End: 2020-01-14

## 2020-01-14 MED ORDER — ONDANSETRON HCL 4 MG/2ML IJ SOLN: 4.0000 mg | Freq: Four times a day (QID) | INTRAMUSCULAR | Status: DC | PRN

## 2020-01-14 MED ORDER — ACETAMINOPHEN 325 MG PO TABS: 650.0000 mg | ORAL_TABLET | ORAL | Status: DC | PRN

## 2020-01-14 MED ORDER — OXYTOCIN BOLUS FROM INFUSION
333.0000 mL | Freq: Once | INTRAVENOUS | Status: AC
  Administered 2020-01-15: 02:00:00 333 mL via INTRAVENOUS

## 2020-01-14 MED ORDER — TERBUTALINE SULFATE 1 MG/ML IJ SOLN: 0.2500 mg | Freq: Once | INTRAMUSCULAR | Status: DC | PRN

## 2020-01-14 MED ORDER — PHENYLEPHRINE 40 MCG/ML (10ML) SYRINGE FOR IV PUSH (FOR BLOOD PRESSURE SUPPORT)
80.0000 ug | PREFILLED_SYRINGE | INTRAVENOUS | Status: DC | PRN
  Administered 2020-01-15: 01:00:00 120 ug via INTRAVENOUS

## 2020-01-14 MED ORDER — LACTATED RINGERS IV SOLN: 500.0000 mL | Freq: Once | INTRAVENOUS | Status: DC

## 2020-01-14 MED ORDER — EPHEDRINE 5 MG/ML INJ: 10.0000 mg | INTRAVENOUS | Status: DC | PRN

## 2020-01-14 MED ORDER — OXYCODONE-ACETAMINOPHEN 5-325 MG PO TABS: 2.0000 | ORAL_TABLET | ORAL | Status: DC | PRN

## 2020-01-14 MED ORDER — LACTATED RINGERS IV SOLN: INTRAVENOUS | Status: DC

## 2020-01-14 MED ORDER — FENTANYL-BUPIVACAINE-NACL 0.5-0.125-0.9 MG/250ML-% EP SOLN: 12.0000 mL/h | EPIDURAL | Status: DC | PRN

## 2020-01-14 MED ORDER — FLEET ENEMA 7-19 GM/118ML RE ENEM: 1.0000 | ENEMA | RECTAL | Status: DC | PRN

## 2020-01-14 MED ORDER — LACTATED RINGERS IV SOLN: 500.0000 mL | INTRAVENOUS | Status: DC | PRN

## 2020-01-14 MED ORDER — LIDOCAINE HCL (PF) 1 % IJ SOLN: 30.0000 mL | INTRAMUSCULAR | Status: DC | PRN

## 2020-01-14 MED ORDER — OXYCODONE-ACETAMINOPHEN 5-325 MG PO TABS: 1.0000 | ORAL_TABLET | ORAL | Status: DC | PRN

## 2020-01-14 MED ORDER — OXYTOCIN-SODIUM CHLORIDE 30-0.9 UT/500ML-% IV SOLN: 2.5000 [IU]/h | INTRAVENOUS | Status: DC

## 2020-01-14 MED ORDER — DIPHENHYDRAMINE HCL 50 MG/ML IJ SOLN: 12.5000 mg | INTRAMUSCULAR | Status: DC | PRN

## 2020-01-14 MED ORDER — PHENYLEPHRINE 40 MCG/ML (10ML) SYRINGE FOR IV PUSH (FOR BLOOD PRESSURE SUPPORT)
80.0000 ug | PREFILLED_SYRINGE | INTRAVENOUS | Status: DC | PRN
  Filled 2020-01-14: qty 10

## 2020-01-14 MED ORDER — OXYTOCIN-SODIUM CHLORIDE 30-0.9 UT/500ML-% IV SOLN
1.0000 m[IU]/min | INTRAVENOUS | Status: DC
  Administered 2020-01-14: 17:00:00 4 m[IU]/min via INTRAVENOUS
  Administered 2020-01-14: 16:00:00 2 m[IU]/min via INTRAVENOUS
  Filled 2020-01-14: qty 500

## 2020-01-14 MED ORDER — PHENYLEPHRINE 40 MCG/ML (10ML) SYRINGE FOR IV PUSH (FOR BLOOD PRESSURE SUPPORT)
80.0000 ug | PREFILLED_SYRINGE | INTRAVENOUS | Status: AC | PRN
  Administered 2020-01-15 (×3): 80 ug via INTRAVENOUS

## 2020-01-14 MED ORDER — FENTANYL-BUPIVACAINE-NACL 0.5-0.125-0.9 MG/250ML-% EP SOLN
12.0000 mL/h | EPIDURAL | Status: DC | PRN
  Filled 2020-01-14: qty 250

## 2020-01-14 MED ORDER — FENTANYL CITRATE (PF) 100 MCG/2ML IJ SOLN
50.0000 ug | INTRAMUSCULAR | Status: DC | PRN
  Administered 2020-01-14: 100 ug via INTRAVENOUS
  Filled 2020-01-14: qty 2

## 2020-01-14 MED ORDER — SOD CITRATE-CITRIC ACID 500-334 MG/5ML PO SOLN: 30.0000 mL | ORAL | Status: DC | PRN

## 2020-01-14 NOTE — Progress Notes (Signed)
Patient seen and examined.  C/o cramping in low back  BP 128/70   Pulse 90   Temp 97.7 F (36.5 C) (Oral)   Resp 16   Ht 5\' 3"  (1.6 m)   Wt 90.4 kg   LMP 04/20/2019   BMI 35.30 kg/m   Toco:  Irregular contractions EFM: 130s, moderate variability, + accels, category 1 SVE: 5/50/-2/posterior/soft / AROM clear fluid  A/P:  06/20/2019 at [redacted]w[redacted]d w EIOL Still in latent labor Continue pitocin Epidural upon request

## 2020-01-14 NOTE — H&P (Signed)
29 y.o. [id number] @ [redacted]w[redacted]d presents for elective induction of labor at term.  Otherwise has good fetal movement and no bleeding. Her pregnancy has been uncomplicated  Past Medical History:  Diagnosis Date  . History of cholestasis during pregnancy   . History of gestational diabetes in prior pregnancy, currently pregnant in third trimester   . Idiopathic thrombocytopenic purpura (ITP) (HCC)   . No pertinent past medical history     Past Surgical History:  Procedure Laterality Date  . DILATION AND CURETTAGE OF UTERUS    . NOSE SURGERY    . WISDOM TOOTH EXTRACTION      OB History  Gravida Para Term Preterm AB Living  4 2 2  0 1 2  SAB IAB Ectopic Multiple Live Births  1 0 0 0 2    # Outcome Date GA Lbr Len/2nd Weight Sex Delivery Anes PTL Lv  4 Current           3 Term 07/05/17 [redacted]w[redacted]d 02:36 / 00:14 3266 g M Vag-Spont EPI  LIV     Birth Comments: WNL  2 Term 10/09/11 [redacted]w[redacted]d 06:42 / 00:53 3065 g F Vag-Spont EPI  LIV  1 SAB 2010            Social History   Socioeconomic History  . Marital status: Married    Spouse name: Not on file  . Number of children: Not on file  . Years of education: Not on file  . Highest education level: Not on file  Occupational History  . Not on file  Tobacco Use  . Smoking status: Never Smoker  . Smokeless tobacco: Never Used  Vaping Use  . Vaping Use: Never used  Substance and Sexual Activity  . Alcohol use: No  . Drug use: No  . Sexual activity: Yes  Other Topics Concern  . Not on file  Social History Narrative  . Not on file      Penicillins    Prenatal Transfer Tool  Maternal Diabetes: No Genetic Screening: Normal Maternal Ultrasounds/Referrals: Normal Fetal Ultrasounds or other Referrals:  None Maternal Substance Abuse:  No Significant Maternal Medications:  None Significant Maternal Lab Results: Group B Strep negative  ABO, Rh: --/--/O POS (12/28 1604) Antibody: NEG (12/28 1604) Rubella:  Immune RPR:   NR HBsAg:    Negative HIV:   Negative GBS:   Negative    Vitals:   01/14/20 1621 01/14/20 1700  BP: 130/71 (!) 131/59  Pulse: (!) 109 89  Resp:  18  Temp:  97.7 F (36.5 C)     General:  NAD Abdomen:  soft, gravid, EFW 7# Ex:  trace edema SVE:  3/40/-s FHTs:  140s, moderate variability, + accelerations, category 1 Toco:  irregular   A/P   29 y.o. 26 [redacted]w[redacted]d presents for elective induction of labor at term IOL: cervix favorable.  Will start pitocin FSR/ vtx/ GBS negative  Paige Welch Paige Welch

## 2020-01-15 ENCOUNTER — Inpatient Hospital Stay (HOSPITAL_COMMUNITY): Payer: Commercial Managed Care - PPO | Admitting: Anesthesiology

## 2020-01-15 ENCOUNTER — Other Ambulatory Visit (HOSPITAL_COMMUNITY): Admission: RE | Admit: 2020-01-15 | Payer: Commercial Managed Care - PPO | Source: Ambulatory Visit

## 2020-01-15 ENCOUNTER — Encounter (HOSPITAL_COMMUNITY): Payer: Self-pay | Admitting: Obstetrics and Gynecology

## 2020-01-15 LAB — CBC
HCT: 33.2 % — ABNORMAL LOW (ref 36.0–46.0)
Hemoglobin: 10.7 g/dL — ABNORMAL LOW (ref 12.0–15.0)
MCH: 28.6 pg (ref 26.0–34.0)
MCHC: 32.2 g/dL (ref 30.0–36.0)
MCV: 88.8 fL (ref 80.0–100.0)
Platelets: 247 10*3/uL (ref 150–400)
RBC: 3.74 MIL/uL — ABNORMAL LOW (ref 3.87–5.11)
RDW: 14.8 % (ref 11.5–15.5)
WBC: 17.4 10*3/uL — ABNORMAL HIGH (ref 4.0–10.5)
nRBC: 0 % (ref 0.0–0.2)

## 2020-01-15 LAB — RPR: RPR Ser Ql: NONREACTIVE

## 2020-01-15 MED ORDER — COCONUT OIL OIL: 1.0000 "application " | TOPICAL_OIL | Status: DC | PRN

## 2020-01-15 MED ORDER — OXYCODONE HCL 5 MG PO TABS
5.0000 mg | ORAL_TABLET | ORAL | Status: DC | PRN
  Administered 2020-01-15 – 2020-01-16 (×2): 5 mg via ORAL
  Filled 2020-01-15 (×2): qty 1

## 2020-01-15 MED ORDER — DIBUCAINE (PERIANAL) 1 % EX OINT: 1.0000 "application " | TOPICAL_OINTMENT | CUTANEOUS | Status: DC | PRN

## 2020-01-15 MED ORDER — OXYCODONE HCL 5 MG PO TABS: 10.0000 mg | ORAL_TABLET | ORAL | Status: DC | PRN

## 2020-01-15 MED ORDER — TETANUS-DIPHTH-ACELL PERTUSSIS 5-2.5-18.5 LF-MCG/0.5 IM SUSY: 0.5000 mL | PREFILLED_SYRINGE | Freq: Once | INTRAMUSCULAR | Status: DC

## 2020-01-15 MED ORDER — WITCH HAZEL-GLYCERIN EX PADS: 1.0000 "application " | MEDICATED_PAD | CUTANEOUS | Status: DC | PRN

## 2020-01-15 MED ORDER — SIMETHICONE 80 MG PO CHEW: 80.0000 mg | CHEWABLE_TABLET | ORAL | Status: DC | PRN

## 2020-01-15 MED ORDER — DIPHENHYDRAMINE HCL 25 MG PO CAPS: 25.0000 mg | ORAL_CAPSULE | Freq: Four times a day (QID) | ORAL | Status: DC | PRN

## 2020-01-15 MED ORDER — PRENATAL MULTIVITAMIN CH
1.0000 | ORAL_TABLET | Freq: Every day | ORAL | Status: DC
  Administered 2020-01-15 – 2020-01-16 (×2): 1 via ORAL
  Filled 2020-01-15 (×2): qty 1

## 2020-01-15 MED ORDER — SENNOSIDES-DOCUSATE SODIUM 8.6-50 MG PO TABS
2.0000 | ORAL_TABLET | ORAL | Status: DC
  Administered 2020-01-15: 06:00:00 2 via ORAL
  Filled 2020-01-15: qty 2

## 2020-01-15 MED ORDER — BENZOCAINE-MENTHOL 20-0.5 % EX AERO: 1.0000 "application " | INHALATION_SPRAY | CUTANEOUS | Status: DC | PRN

## 2020-01-15 MED ORDER — IBUPROFEN 600 MG PO TABS
600.0000 mg | ORAL_TABLET | Freq: Four times a day (QID) | ORAL | Status: DC
  Administered 2020-01-15 – 2020-01-16 (×6): 600 mg via ORAL
  Filled 2020-01-15 (×6): qty 1

## 2020-01-15 MED ORDER — ONDANSETRON HCL 4 MG/2ML IJ SOLN: 4.0000 mg | INTRAMUSCULAR | Status: DC | PRN

## 2020-01-15 MED ORDER — ONDANSETRON HCL 4 MG PO TABS: 4.0000 mg | ORAL_TABLET | ORAL | Status: DC | PRN

## 2020-01-15 MED ORDER — ACETAMINOPHEN 325 MG PO TABS
650.0000 mg | ORAL_TABLET | ORAL | Status: DC | PRN
  Administered 2020-01-15 (×2): 650 mg via ORAL
  Filled 2020-01-15 (×2): qty 2

## 2020-01-15 MED ORDER — LIDOCAINE HCL (PF) 1 % IJ SOLN
INTRAMUSCULAR | Status: DC | PRN
  Administered 2020-01-15 (×2): 5 mL via EPIDURAL

## 2020-01-15 MED ORDER — SODIUM CHLORIDE (PF) 0.9 % IJ SOLN
INTRAMUSCULAR | Status: DC | PRN
  Administered 2020-01-15: 12 mL/h via EPIDURAL

## 2020-01-15 NOTE — Anesthesia Postprocedure Evaluation (Signed)
Anesthesia Post Note  Patient: Paige Welch  Procedure(s) Performed: AN AD HOC LABOR EPIDURAL     Patient location during evaluation: Mother Baby Anesthesia Type: Epidural Level of consciousness: awake and alert Pain management: pain level controlled Vital Signs Assessment: post-procedure vital signs reviewed and stable Respiratory status: spontaneous breathing, nonlabored ventilation and respiratory function stable Cardiovascular status: stable Postop Assessment: no headache, no backache and epidural receding Anesthetic complications: no   No complications documented.  Last Vitals:  Vitals:   01/15/20 0430 01/15/20 0530  BP: (!) 122/59 121/73  Pulse: 92 79  Resp: 18 18  Temp: 36.5 C 37.2 C  SpO2:      Last Pain:  Vitals:   01/15/20 0559  TempSrc:   PainSc: 3    Pain Goal:                   [name]

## 2020-01-15 NOTE — Anesthesia Preprocedure Evaluation (Signed)
Anesthesia Evaluation  Patient identified by MRN, date of birth, ID band Patient awake    Reviewed: Allergy & Precautions, H&P , NPO status , Patient's Chart, lab work & pertinent test results  History of Anesthesia Complications Negative for: history of anesthetic complications  Airway Mallampati: II  TM Distance: >3 FB Neck ROM: full    Dental no notable dental hx. (+) Teeth Intact   Pulmonary neg pulmonary ROS,    Pulmonary exam normal breath sounds clear to auscultation       Cardiovascular negative cardio ROS Normal cardiovascular exam Rhythm:regular Rate:Normal     Neuro/Psych negative neurological ROS  negative psych ROS   GI/Hepatic negative GI ROS, Neg liver ROS,   Endo/Other  negative endocrine ROS  Renal/GU negative Renal ROS  negative genitourinary   Musculoskeletal   Abdominal (+) + obese,   Peds  Hematology  (+) Blood dyscrasia, anemia , Idiopathic thrombocytopenic purpura (ITP)    Anesthesia Other Findings   Reproductive/Obstetrics (+) Pregnancy                             Anesthesia Physical Anesthesia Plan  ASA: II  Anesthesia Plan: Epidural   Post-op Pain Management:    Induction:   PONV Risk Score and Plan:   Airway Management Planned:   Additional Equipment:   Intra-op Plan:   Post-operative Plan:   Informed Consent: I have reviewed the patients History and Physical, chart, labs and discussed the procedure including the risks, benefits and alternatives for the proposed anesthesia with the patient or authorized representative who has indicated his/her understanding and acceptance.       Plan Discussed with:   Anesthesia Plan Comments:         Anesthesia Quick Evaluation

## 2020-01-15 NOTE — Anesthesia Procedure Notes (Signed)
Epidural Patient location during procedure: OB Start time: 01/15/2020 12:27 AM End time: 01/15/2020 12:37 AM  Staffing Anesthesiologist: Leonides Grills, MD Performed: anesthesiologist   Preanesthetic Checklist Completed: patient identified, IV checked, site marked, risks and benefits discussed, monitors and equipment checked, pre-op evaluation and timeout performed  Epidural Patient position: sitting Prep: DuraPrep Patient monitoring: heart rate, cardiac monitor, continuous pulse ox and blood pressure Approach: midline Location: L4-L5 Injection technique: LOR air  Needle:  Needle type: Tuohy  Needle gauge: 17 G Needle length: 9 cm Needle insertion depth: 6 cm Catheter type: closed end flexible Catheter size: 19 Gauge Catheter at skin depth: 11 cm Test dose: negative and Other  Assessment Events: blood not aspirated, injection not painful, no injection resistance and negative IV test  Additional Notes Informed consent obtained prior to proceeding including risk of failure, 1% risk of PDPH, risk of minor discomfort and bruising. Discussed alternatives to epidural analgesia and patient desires to proceed.  Timeout performed pre-procedure verifying patient name, procedure, and platelet count.  Patient tolerated procedure well. Reason for block:procedure for pain

## 2020-01-15 NOTE — Consult Note (Signed)
Delivery Note:  SVD    01/15/2020  2:02 AM  I was called to the delivery room at the request of the patient's obstetrician (Dr. Clark) for shoulder dystocia.  PRENATAL HX:  This is a 29 y/o G4P2012 at 39 and 2/[redacted] weeks gestation who was admitted in labor.  Pregnancy uncomplicated.  She is GBS negative with AROM x5 hours.  NICU team called for shoulder dystocia.    DELIVERY:  NICU team arrived at 1 and a half minutes of age and infant was on warmer.  She did not require PPV and had begun to cry spontaneously.  A pulse oximeter was applied and O2 saturations were in the mid 80s.  By 5 minutes, O2 saturations in mid to high 90s. APGAR at 5 minutes was 9.  Exam notable for facial bruising and molding but was otherwise within normal limits.  She was moving both arms spontaneously.  After 5 minutes, baby left with nurse to assist parents with skin-to-skin care.   _____________________ Electronically Signed By: Emmarie Sannes, MD Neonatologist  

## 2020-01-15 NOTE — Progress Notes (Signed)
Patient is doing well.  She is ambulating, voiding, tolerating PO.  Pain control is good.  Lochia is appropriate  Vitals:   01/15/20 0301 01/15/20 0316 01/15/20 0430 01/15/20 0530  BP: 119/70 123/60 (!) 122/59 121/73  Pulse: 92 95 92 79  Resp: 18 18 18 18   Temp:   97.7 F (36.5 C) 98.9 F (37.2 C)  TempSrc:   Oral Oral  SpO2:      Weight:      Height:        NAD Fundus firm Ext: trace edema bilaterally  Lab Results  Component Value Date   WBC 17.4 (H) 01/15/2020   HGB 10.7 (L) 01/15/2020   HCT 33.2 (L) 01/15/2020   MCV 88.8 01/15/2020   PLT 247 01/15/2020    --/--/O POS (12/28 1604)/RImmune  A/P 29 y.o. 03-02-1977 PPD#0. Routine care.   Expect d/c tomorrow.    [facility]

## 2020-01-15 NOTE — Lactation Note (Signed)
This note was copied from a baby's chart. Lactation Consultation Note  Patient Name: Paige Welch [id number] Date: 01/15/2020 Reason for consult: Initial assessment Age:28 hours   P3, Mother states she knows how to hand express.   Mother breastfed her first two children for approx 6 mos. Mother states she had difficulty at times latching to do nipple inversion. Mother states she feels this baby is latching well.   Offered mother hand pump to prepump and demonstrated use. Offered latch assistance and mother declined due to baby sleeping at this time. Feed on demand with cues.  Goal 8-12+ times per day after first 24 hrs.  Place baby STS if not cueing.  Provided lactation brochure.  Mother knows to call if assistance is needed.   Maternal Data Interventions Interventions: Breast feeding basics reviewed;Hand pump  Lactation Tools Discussed/Used  Manual pump   Consult Status Consult Status: Follow-up Date: 01/15/20 Follow-up type: In-patient    [name] [facility] 01/15/2020, 9:00 AM

## 2020-01-15 NOTE — Social Work (Signed)
MOB was referred for history of depression and anxiety.   * Referral screened out by Clinical Social Worker because none of the following criteria appear to apply:  ~ History of anxiety/depression during this pregnancy, or of post-partum depression following prior delivery. ~ Diagnosis of anxiety and/or depression within last 3 years. Per chart review, [facility] has a documented diagnosis date of 2017.  OR * MOB's symptoms currently being treated with medication and/or therapy.  Please contact the Clinical Social Worker if needs arise, by MOB request, or if MOB scores greater than 9/yes to question 10 on Edinburgh Postpartum Depression Screen.  Jahlani Lorentz, LCSWA Clinical Social Work [facility]  [phone]  

## 2020-01-16 MED ORDER — IBUPROFEN 600 MG PO TABS: 600.0000 mg | ORAL_TABLET | Freq: Four times a day (QID) | ORAL | 0 refills | Status: DC | PRN

## 2020-01-16 NOTE — Lactation Note (Signed)
This note was copied from a baby's chart. Lactation Consultation Note  Patient Name: Paige Welch [id number] Date: 01/16/2020 Reason for consult: Follow-up assessment Age:28 hours  Mother states breastfeeding is going well.  Denies questions or concerns. Feed on demand with cues.  Goal 8-12+ times per day after first 24 hrs.  Place baby STS if not cueing.  Reviewed engorgement care and monitoring voids/stools.   Feeding Feeding Type: Breast Fed  LATCH Score                   Interventions Interventions: Breast feeding basics reviewed  Lactation Tools Discussed/Used     Consult Status Consult Status: Complete Date: 01/16/20    [name] [facility] 01/16/2020, 8:22 AM

## 2020-01-16 NOTE — Discharge Summary (Signed)
Postpartum Discharge Summary     Patient Name: Paige Welch DOB: December 15, 1990 MRN: [id number]  Date of admission: 01/14/2020 Delivery date:01/15/2020  Delivering provider: Marlow Baars  Date of discharge: 01/16/2020  Admitting diagnosis: Term pregnancy [Z34.90] Intrauterine pregnancy: [redacted]w[redacted]d     Secondary diagnosis:  Active Problems:   Term pregnancy  Additional problems: Shoulder dystocia    Discharge diagnosis: Term Pregnancy Delivered                                              Post partum procedures:NA Augmentation: AROM and Pitocin Complications: None  Hospital course: Induction of Labor With Vaginal Delivery   29 y.o. yo [phone] at [redacted]w[redacted]d was admitted to the hospital 01/14/2020 for induction of labor.  Indication for induction: Elective.  Patient had an uncomplicated labor course as follows: Membrane Rupture Time/Date: 9:02 PM ,01/14/2020   Delivery Method:Vaginal, Spontaneous  Episiotomy: None  Lacerations:  None  Details of delivery can be found in separate delivery note.  Patient had a routine postpartum course. Patient is discharged home 01/16/20.  Newborn Data: Birth date:01/15/2020  Birth time:1:49 AM  Gender:Female  Living status:Living  Apgars:4 ,9  Weight:3986 g    Physical exam  Vitals:   01/15/20 1416 01/15/20 1739 01/15/20 2021 01/16/20 0519  BP: 113/72 123/75 124/77 106/66  Pulse: 89 89 95 91  Resp: 20 20 18 18   Temp: 98.4 F (36.9 C) 98.4 F (36.9 C) 98.5 F (36.9 C) 98.3 F (36.8 C)  TempSrc: Oral Axillary Oral Oral  SpO2: 98%  99% 98%  Weight:      Height:       General: alert, cooperative and no distress Lochia: appropriate Uterine Fundus: firm DVT Evaluation: No evidence of DVT seen on physical exam. Labs: Lab Results  Component Value Date   WBC 17.4 (H) 01/15/2020   HGB 10.7 (L) 01/15/2020   HCT 33.2 (L) 01/15/2020   MCV 88.8 01/15/2020   PLT 247 01/15/2020   CMP Latest Ref Rng & Units 06/24/2017  Glucose 65 - 99 mg/dL  08/24/2017)  BUN 6 - 20 mg/dL 6  Creatinine 709(G - 2.83 mg/dL 6.62  Sodium 9.47 - 654 mmol/L 138  Potassium 3.5 - 5.1 mmol/L 3.7  Chloride 101 - 111 mmol/L 110  CO2 22 - 32 mmol/L 19(L)  Calcium 8.9 - 10.3 mg/dL 650)  Total Protein 6.5 - 8.1 g/dL 6.6  Total Bilirubin 0.3 - 1.2 mg/dL 0.5  Alkaline Phos 38 - 126 U/L 148(H)  AST 15 - 41 U/L 18  ALT 14 - 54 U/L 17   Edinburgh Score: Edinburgh Postnatal Depression Scale Screening Tool 01/16/2020  I have been able to laugh and see the funny side of things. 0  I have looked forward with enjoyment to things. 0  I have blamed myself unnecessarily when things went wrong. 1  I have been anxious or worried for no good reason. 0  I have felt scared or panicky for no good reason. 0  Things have been getting on top of me. 0  I have been so unhappy that I have had difficulty sleeping. 0  I have felt sad or miserable. 0  I have been so unhappy that I have been crying. 0  The thought of harming myself has occurred to me. 0  Edinburgh Postnatal Depression Scale Total 1  After visit meds:  Allergies as of 01/16/2020      Reactions   Penicillins Hives   Has patient had a PCN reaction causing immediate rash, facial/tongue/throat swelling, SOB or lightheadedness with hypotension:  yes Has patient had a PCN reaction causing severe rash involving mucus membranes or skin necrosis: no Has patient had a PCN reaction that required hospitalization: no Has patient had a PCN reaction occurring within the last 10 years: no If all of the above answers are "NO", then may proceed with Cephalosporin use.      Medication List    STOP taking these medications   loratadine 10 MG tablet Commonly known as: CLARITIN     TAKE these medications   docusate sodium 100 MG capsule Commonly known as: COLACE Take 1 capsule (100 mg total) by mouth in the morning, at noon, and at bedtime.   ibuprofen 600 MG tablet Commonly known as: ADVIL Take 1 tablet (600 mg  total) by mouth every 6 (six) hours as needed.   multivitamin-prenatal 27-0.8 MG Tabs tablet Take 1 tablet by mouth daily at 12 noon.   polyethylene glycol 17 g packet Commonly known as: MIRALAX / GLYCOLAX Take 17 g by mouth daily.        Discharge home in stable condition Infant Feeding: Breast Infant Disposition:home with mother Discharge instruction: per After Visit Summary and Postpartum booklet. Activity: Advance as tolerated. Pelvic rest for 6 weeks.  Diet: routine diet Anticipated Birth Control: Unsure Postpartum Appointment:4 weeks Future Appointments:No future appointments. Follow up Visit:  Follow-up Information    Marlow Baars, MD Follow up in 4 week(s).   Specialty: Obstetrics Why: For a postpartum evaluation Contact information: [address] [place] [address] [phone]                   01/16/2020 Paige Reeds, MD

## 2020-01-17 ENCOUNTER — Inpatient Hospital Stay (HOSPITAL_COMMUNITY): Payer: Commercial Managed Care - PPO

## 2022-01-09 IMAGING — US US OB COMP LESS 14 WK
1 series · 15 of 28 positions shown · non-contrast
Comparison: Prior gestational ultrasound 10/18/2010

CLINICAL DATA: Pregnancy of unknown location

EXAM:
OBSTETRIC <14 WK ULTRASOUND
TECHNIQUE: Transabdominal ultrasound was performed for evaluation of the
gestation as well as the maternal uterus and adnexal regions.

[Series 1: us ob comp less 14 wk · 36 acquisitions, 15 frames shown]
[im 1/36]
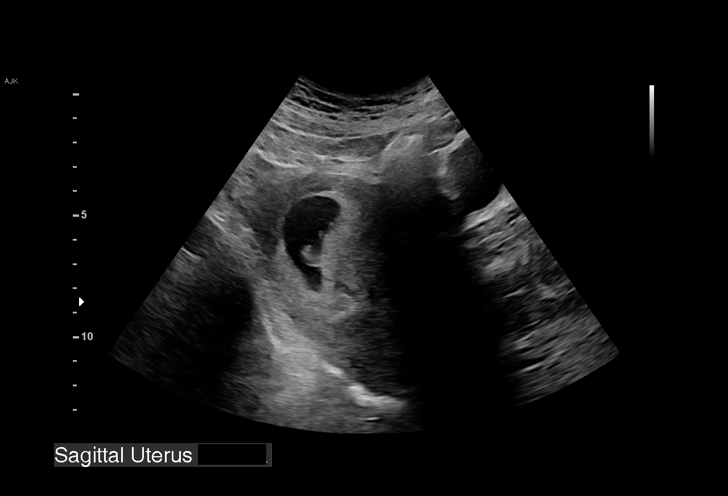
[im 3/36]
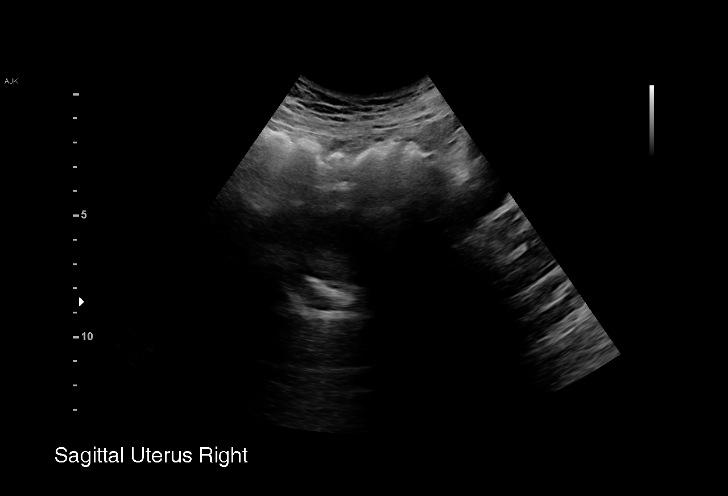
[im 6/36]
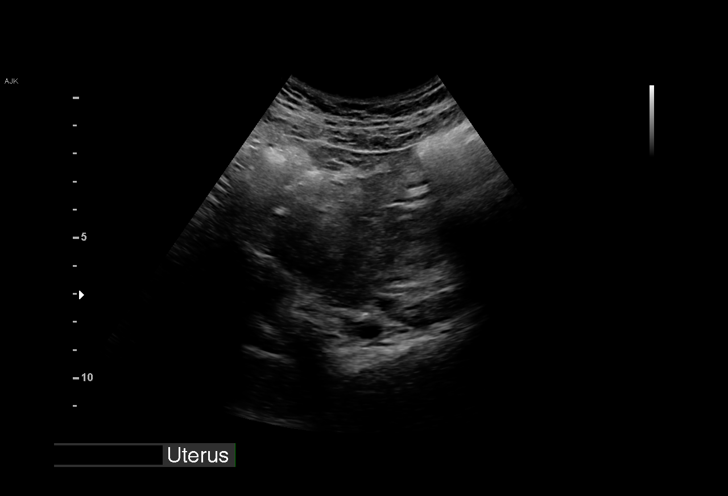
[im 8/36]
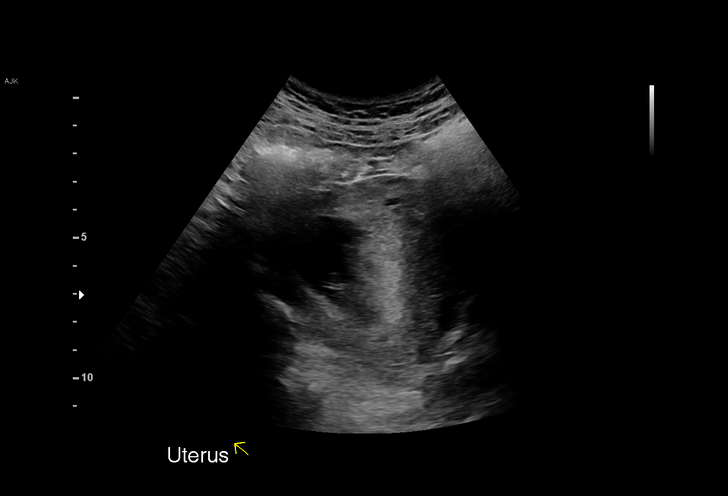
[im 11/36]
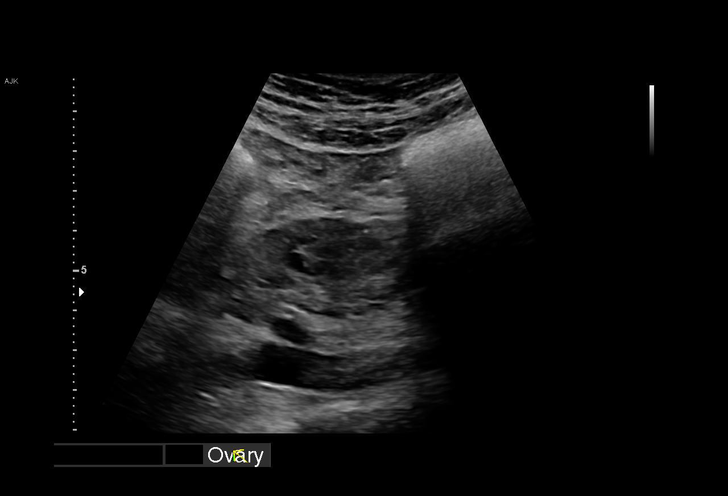
[im 13/36]
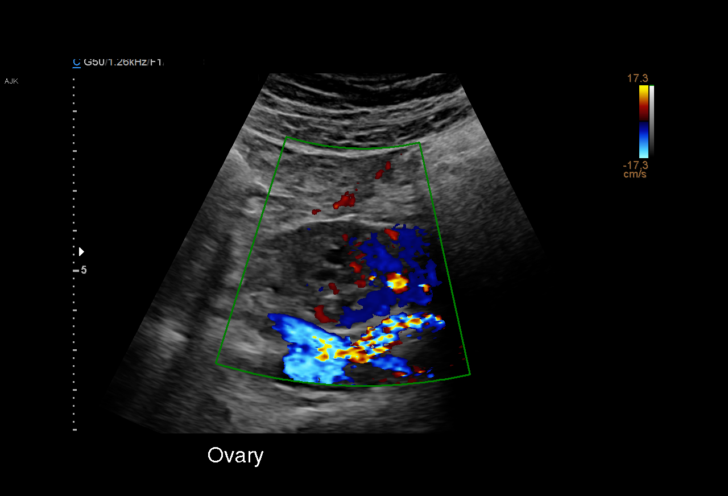
[im 16/36]
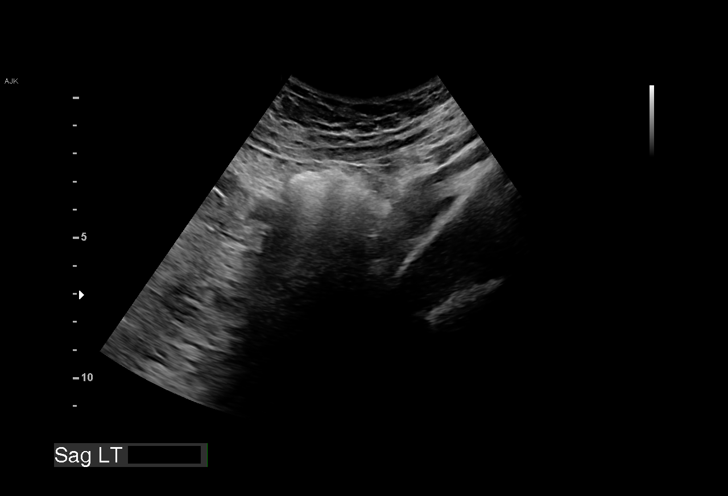
[im 19/36]
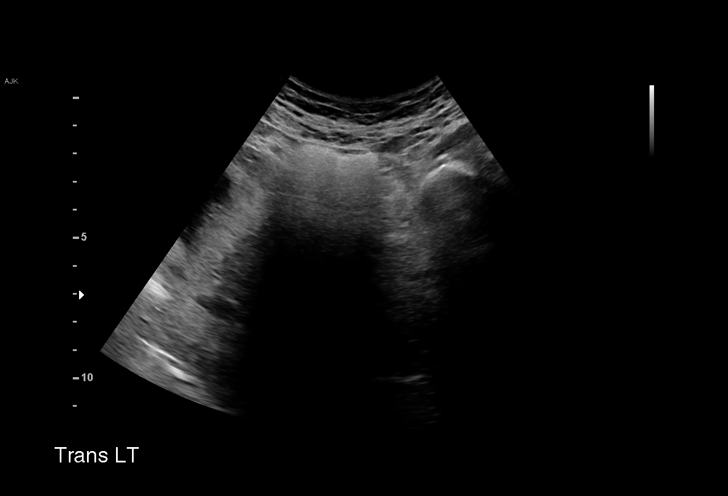
[im 20/36]
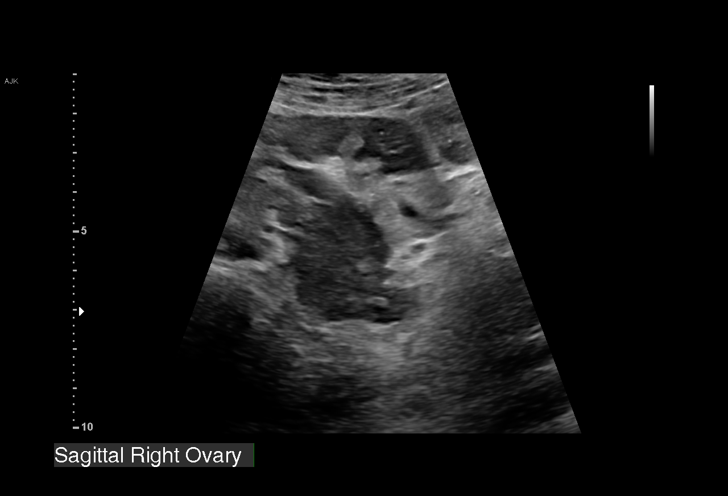
[im 23/36]
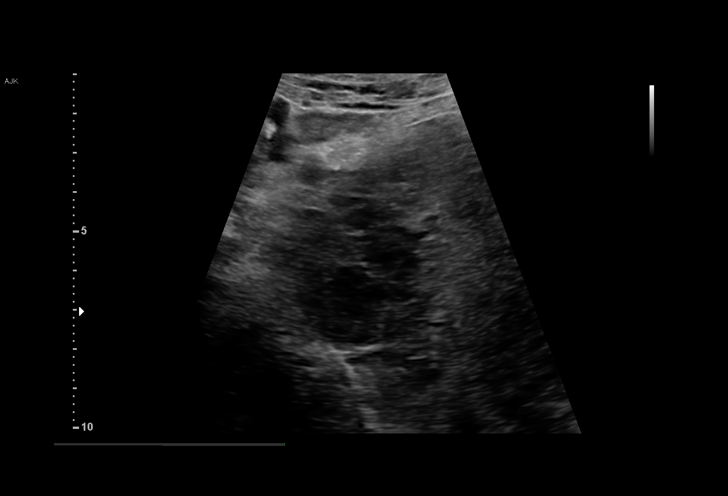
[im 25/36]
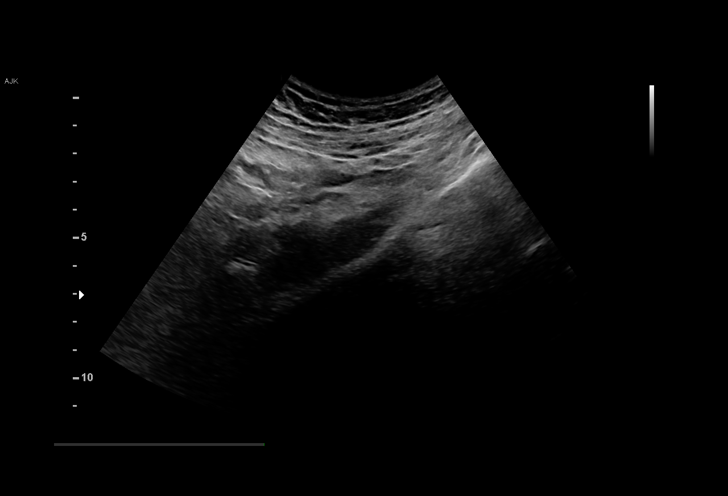
[im 28/36]
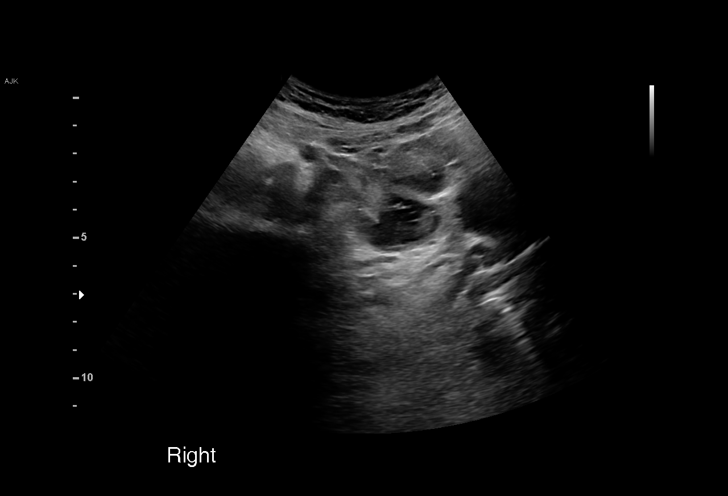
[im 30/36]
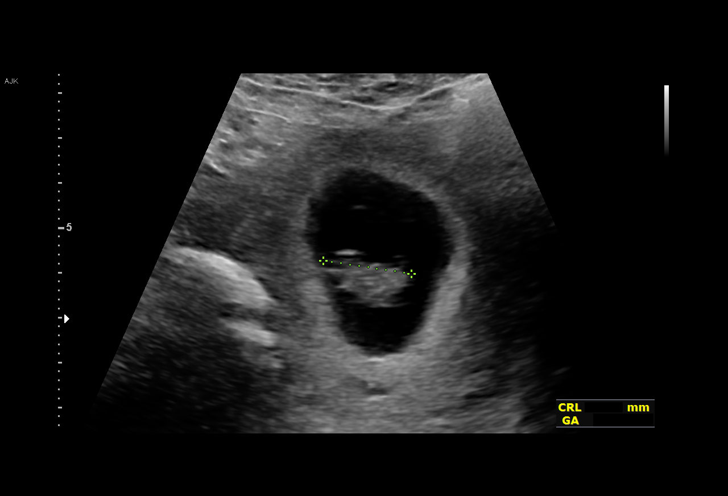
[im 33/36]
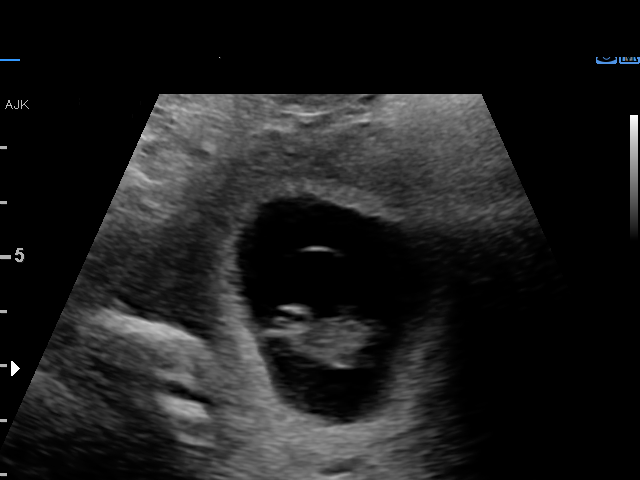
[im 36/36]
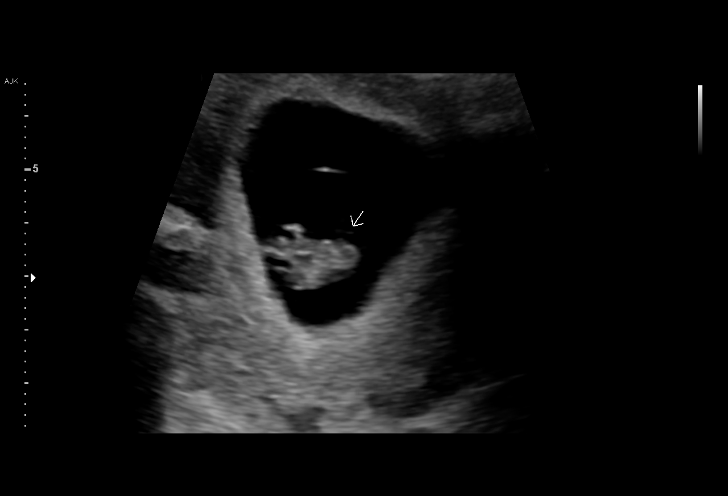

[15 of 28 positions shown; findings below may reference images not displayed]

FINDINGS: Intrauterine gestational sac: Single

Yolk sac:  Visualized.

Embryo:  Visualized.

Cardiac Activity: Visualized.

Heart Rate: 174 bpm

CRL:   20.1 mm   8 w 4 d                  US EDC: 01/19/2020

Subchorionic hemorrhage:  None visualized.

Maternal uterus/adnexae: Normal appearance of the maternal uterus
and ovaries. Right ovary measures 2.9 x 2.5 x 2.7 cm, left ovary
x 2.7 x 4.2 cm. No free fluid.
IMPRESSION: Single viable intrauterine gestation at approximately 8 weeks 4 days
by crown-rump length sonographic estimation.

## 2023-05-03 ENCOUNTER — Ambulatory Visit: Admitting: Allergy and Immunology

## 2023-05-03 ENCOUNTER — Encounter: Payer: Self-pay | Admitting: Allergy and Immunology

## 2023-05-03 VITALS — BP 104/60 | HR 94 | Resp 16 | Ht 63.0 in | Wt 159.4 lb

## 2023-05-03 DIAGNOSIS — J301 Allergic rhinitis due to pollen: Secondary | ICD-10-CM

## 2023-05-03 DIAGNOSIS — J3089 Other allergic rhinitis: Secondary | ICD-10-CM

## 2023-05-03 DIAGNOSIS — H1010 Acute atopic conjunctivitis, unspecified eye: Secondary | ICD-10-CM

## 2023-05-03 DIAGNOSIS — H1013 Acute atopic conjunctivitis, bilateral: Secondary | ICD-10-CM | POA: Diagnosis not present

## 2023-05-03 MED ORDER — LOTEPREDNOL ETABONATE 0.5 % OP SUSP: OPHTHALMIC | 5 refills | Status: AC

## 2023-05-03 MED ORDER — RYALTRIS 665-25 MCG/ACT NA SUSP: NASAL | 5 refills | Status: AC

## 2023-05-03 NOTE — Progress Notes (Unsigned)
 [place] - [place] - [place] - [place] - [place]   Dear Paige Welch,  Thank you for referring Paige Welch to the [facility] [place] on 05/03/2023.   Below is a summation of this patient's evaluation and recommendations.  Thank you for your referral. I will keep you informed about this patient's response to treatment.   If you have any questions please do not hesitate to contact me.   Sincerely,  Paige Priest, MD Allergy / Immunology [facility]   ______________________________________________________________________    NEW PATIENT NOTE  Referring Provider: Shelbie Ammons, MD Primary Provider: Patient, No Pcp Per Date of office visit: 05/03/2023    Subjective:   Chief Complaint:  Paige Welch (DOB: [birth year]) is a 33 y.o. female who presents to the clinic on 05/03/2023 with a chief complaint of Allergic Rhinitis  .     HPI: Paige Welch presents to this clinic in evaluation of allergies.  She has a long history of allergic rhinitis manifested as nasal congestion and sneezing and itchy watery eyes and itchy throat since childhood that has been a progressive issue getting worse each year expressing itself spring through fall season unresponsive to the use of antihistamines and eyedrops.  Provoking factors for symptoms include exposure to pollen and cats and dogs.  She does not have any other associated atopic symptomatology.  Past Medical History:  Diagnosis Date   History of cholestasis during pregnancy    History of gestational diabetes in prior pregnancy, currently pregnant in third trimester    Idiopathic thrombocytopenic purpura (ITP) (HCC)    No pertinent past medical history     Past Surgical History:  Procedure Laterality Date   DILATION AND CURETTAGE OF UTERUS     NOSE SURGERY     WISDOM TOOTH EXTRACTION      Allergies as of 05/03/2023       Reactions   Penicillins Hives    Has patient had a PCN reaction causing immediate rash, facial/tongue/throat swelling, SOB or lightheadedness with hypotension:  yes Has patient had a PCN reaction causing severe rash involving mucus membranes or skin necrosis: no Has patient had a PCN reaction that required hospitalization: no Has patient had a PCN reaction occurring within the last 10 years: no If all of the above answers are "NO", then may proceed with Cephalosporin use.        Medication List    fexofenadine 180 MG tablet Commonly known as: ALLEGRA Take 180 mg by mouth daily.   loratadine 10 MG tablet Commonly known as: CLARITIN Take 10 mg by mouth daily.    Review of systems negative except as noted in HPI / PMHx or noted below:  Review of Systems  Constitutional: Negative.   HENT: Negative.    Eyes: Negative.   Respiratory: Negative.    Cardiovascular: Negative.   Gastrointestinal: Negative.   Genitourinary: Negative.   Musculoskeletal: Negative.   Skin: Negative.   Neurological: Negative.   Endo/Heme/Allergies: Negative.   Psychiatric/Behavioral: Negative.      Family History  Problem Relation Age of Onset   Diabetes Maternal Aunt    Heart disease Maternal Uncle    Allergic rhinitis Maternal Grandmother    Hypertension Maternal Grandmother    Stroke Maternal Grandfather    Anesthesia problems Neg Hx     Social History   Socioeconomic History   Marital status: Married    Spouse name: Not on file  Number of children: Not on file   Years of education: Not on file   Highest education level: Not on file  Occupational History   Not on file  Tobacco Use   Smoking status: Never   Smokeless tobacco: Never  Vaping Use   Vaping status: Never Used  Substance and Sexual Activity   Alcohol use: No   Drug use: No   Sexual activity: Yes  Other Topics Concern   Not on file  Social History Narrative   Not on file    Environmental and Social history  Lives in a house with a dry  environment, no animals looking inside the household, carpet in the bedroom, plastic on the bed, plastic on the pillow, and no smoking ongoing with inside the household.  She works at an office setting.  She has children who are involved with outdoor activities spring through fall.  Objective:   Vitals:   05/03/23 0901  BP: 104/60  Pulse: 94  Resp: 16  SpO2: 97%   Height: 5\' 3"  (160 cm) Weight: 159 lb 6.4 oz (72.3 kg)  Physical Exam Constitutional:      Appearance: She is not diaphoretic.  HENT:     Head: Normocephalic.     Right Ear: Tympanic membrane, ear canal and external ear normal.     Left Ear: Tympanic membrane, ear canal and external ear normal.     Nose: Nose normal. No mucosal edema or rhinorrhea.     Mouth/Throat:     Pharynx: Uvula midline. No oropharyngeal exudate.  Eyes:     Conjunctiva/sclera: Conjunctivae normal.  Neck:     Thyroid: No thyromegaly.     Trachea: Trachea normal. No tracheal tenderness or tracheal deviation.  Cardiovascular:     Rate and Rhythm: Normal rate and regular rhythm.     Heart sounds: Normal heart sounds, S1 normal and S2 normal. No murmur heard. Pulmonary:     Effort: No respiratory distress.     Breath sounds: Normal breath sounds. No stridor. No wheezing or rales.  Lymphadenopathy:     Head:     Right side of head: No tonsillar adenopathy.     Left side of head: No tonsillar adenopathy.     Cervical: No cervical adenopathy.  Skin:    Findings: No erythema or rash.     Nails: There is no clubbing.  Neurological:     Mental Status: She is alert.     Diagnostics: Allergy skin tests were not performed.   Assessment and Plan:    No diagnosis found.  Patient Instructions   1. Use sports glasses while outdoors  2. Never, ever rub, touch eyes  3. Treat and prevent inflammation of nose and eyes:   A. Ryaltris - 2 sprays each nostril 1-2 times per day  B. Pataday - 1 drop each eye 1 time per day  C. Lotemax - 1 drop  each eye 1 time per day  4. If needed:   A. Cetirizine 10 mg or fexofenadine 180  - 1 tablet 1-2 times per day  5. Return for skin testing without antihistamine use   Paige Welch J. Matalyn Nawaz, MD Allergy / Immunology [facility] [place] 

## 2023-05-03 NOTE — Patient Instructions (Addendum)
  1. Use sports glasses while outdoors  2. Never, ever rub, touch eyes  3. Treat and prevent inflammation of nose and eyes:   A. Ryaltris - 2 sprays each nostril 1-2 times per day  B. Pataday - 1 drop each eye 1 time per day  C. Lotemax - 1 drop each eye 1 time per day  4. If needed:   A. Cetirizine 10 mg or fexofenadine 180  - 1 tablet 1-2 times per day  5. Return for skin testing without antihistamine use

## 2023-05-04 ENCOUNTER — Encounter: Payer: Self-pay | Admitting: Allergy and Immunology

## 2023-05-15 ENCOUNTER — Ambulatory Visit: Admitting: Allergy and Immunology

## 2023-05-15 DIAGNOSIS — J309 Allergic rhinitis, unspecified: Secondary | ICD-10-CM
